# Patient Record
Sex: Female | Born: 1970 | Race: White | Hispanic: No | Marital: Married | Smoking: Never smoker
Health system: Southern US, Community
[De-identification: ages and names within clinical notes are randomized; demographics above are authoritative.]

## PROBLEM LIST (undated history)

## (undated) DIAGNOSIS — G43909 Migraine, unspecified, not intractable, without status migrainosus: Secondary | ICD-10-CM

## (undated) HISTORY — PX: ANTERIOR CRUCIATE LIGAMENT REPAIR: SHX115

## (undated) HISTORY — DX: Migraine, unspecified, not intractable, without status migrainosus: G43.909

## (undated) HISTORY — PX: ABDOMINAL HYSTERECTOMY: SHX81

---

## 1997-07-22 HISTORY — PX: AUGMENTATION MAMMAPLASTY: SUR837

## 2008-02-15 ENCOUNTER — Ambulatory Visit: Payer: Self-pay | Admitting: Physician Assistant

## 2011-08-22 ENCOUNTER — Other Ambulatory Visit: Payer: Self-pay | Admitting: Unknown Physician Specialty

## 2011-08-22 DIAGNOSIS — M25562 Pain in left knee: Secondary | ICD-10-CM

## 2011-08-30 ENCOUNTER — Ambulatory Visit
Admission: RE | Admit: 2011-08-30 | Discharge: 2011-08-30 | Disposition: A | Discharge status: Home / Self Care | Payer: BC Managed Care – PPO | Source: Ambulatory Visit | Attending: Unknown Physician Specialty | Admitting: Unknown Physician Specialty

## 2011-08-30 DIAGNOSIS — M25562 Pain in left knee: Secondary | ICD-10-CM

## 2011-10-14 ENCOUNTER — Ambulatory Visit: Payer: Self-pay | Admitting: Unknown Physician Specialty

## 2012-06-10 ENCOUNTER — Ambulatory Visit: Payer: Self-pay | Admitting: Internal Medicine

## 2013-07-06 ENCOUNTER — Ambulatory Visit: Payer: Self-pay | Admitting: Obstetrics and Gynecology

## 2013-07-06 LAB — BASIC METABOLIC PANEL
Calcium, Total: 9.6 mg/dL (ref 8.5–10.1)
Co2: 28 mmol/L (ref 21–32)
Creatinine: 0.69 mg/dL (ref 0.60–1.30)
EGFR (African American): >60
Glucose: 100 mg/dL — ABNORMAL HIGH (ref 65–99)
Potassium: 4.4 mmol/L (ref 3.5–5.1)
Sodium: 137 mmol/L (ref 136–145)

## 2013-07-06 LAB — HEMOGLOBIN: HGB: 14.3 g/dL (ref 12.0–16.0)

## 2013-07-09 ENCOUNTER — Inpatient Hospital Stay: Payer: Self-pay | Admitting: Obstetrics and Gynecology

## 2013-07-10 LAB — BASIC METABOLIC PANEL
BUN: 9 mg/dL (ref 7–18)
Co2: 27 mmol/L (ref 21–32)
Creatinine: 0.65 mg/dL (ref 0.60–1.30)
Potassium: 4.1 mmol/L (ref 3.5–5.1)

## 2013-07-10 LAB — HEMATOCRIT: HCT: 35.7 % (ref 35.0–47.0)

## 2013-08-06 ENCOUNTER — Ambulatory Visit: Payer: Self-pay | Admitting: Obstetrics and Gynecology

## 2014-09-09 ENCOUNTER — Ambulatory Visit: Payer: Self-pay | Admitting: Internal Medicine

## 2014-11-11 NOTE — Op Note (Signed)
PATIENT NAME:  Felicia Greene, Felicia Greene MR#:  [id number] DATE OF BIRTH:  Dec 13, 1970  DATE OF PROCEDURE:  07/09/2013  PREOPERATIVE DIAGNOSIS: Menorrhagia.   POSTOPERATIVE DIAGNOSIS: Menorrhagia.   PROCEDURES:  1,  Total abdominal hysterectomy.  2.  Bilateral salpingectomies.   ANESTHESIA: General endotracheal anesthesia.  SURGEON: Suzy Bouchardhomas J Schermerhorn, M.D.   FIRST ASSISTANT: Dr. Logan BoresEvans.   INDICATIONS: This is a 44 year old gravida 0 patient with a long history of menorrhagia failing hormonal therapy. The patient has elected for permanent surgical intervention.   PROCEDURE: After adequate general endotracheal anesthesia, the patient was placed in the dorsal supine position. The patient was prepped and draped in normal sterile fashion. Cefoxitin 2 grams of IV was administered prior to commencement. The patient's bladder was then drained with a Foley catheter. A Pfannenstiel incision was then made 2 fingerbreadths above the symphysis pubis. Sharp dissection was used to identify the fascia. The fascia was opened in the midline and opened in a transverse fashion. The superior aspect of the fascia was grasped with Kocher clamps and the recti muscles were dissected free. The inferior aspect of the fascia were grasped with Kocher clamps and the pyramidalis muscle was dissected free. Entry into the peritoneal cavity was accomplished sharply. The O'Connor-O'Sullivan retractor was then brought up to the operative field. The bowels were packed cephalad. Each cornu was grasped with a large Kelly clamp. Attention was directed to the patient's left fallopian tube, which was clamped at the mesosalpinx and the left fallopian tube was removed. Suture ligation of the pedicle with 2-0 Vicryl suture. A similar procedure was repeated on the patient's right fallopian tube after clamping the mesosalpinx and removal of the right fallopian tube pedicle. It again was tied with 2-0 Vicryl suture.   Round ligaments were clamped  bilaterally, transected, and suture ligated with 0 Vicryl suture. A window in the broad ligament was made and the utero-ovarian ligaments were bilaterally clamped, transected, and suture ligated with 0 Vicryl suture. Ovaries appeared normal bilaterally. Uterine arteries were then bilaterally skeletonized after the anterior leaf of the broad ligament was incised along the bladder and reflected inferiorly. The uterine arteries were then bilaterally clamped with Heaney clamps, transected, and suture ligated with 0 Vicryl suture. The Cardinal ligaments were clamped with straight Heaney clamps and pedicles were suture ligated with 0 Vicryl suture. The angles were then clamped with curved Heaney clamps and the cervix and uterus were removed with Jorgenson scissors. The vaginal cuff was then closed with 0 Vicryl suture interrupted sutures. The patient's abdomen was copiously irrigated. Good hemostasis was noted. There was normal peristaltic activity of the bilateral ureters. The sponges were then removed and the retractor was removed. The fascia was then closed with 0 Vicryl suture in a running nonlocking fashion. Good approximation of edges. Good hemostasis was noted. The subcutaneous tissues were irrigated and bovied for hemostasis, and the skin was reapproximated with staples. There were no complications. Estimated blood loss 25 mL. Intraoperative fluids 1400 mL. Urine output 150 mL. The patient tolerated the procedure well and was taken to the PACU. Of note, the patient had SCDs on during the procedure.   ____________________________ Suzy Bouchardhomas J. Schermerhorn, MD tjs:aw D: 07/09/2013 10:00:07 ET T: 07/09/2013 10:33:54 ET JOB#: [id number]  cc: Suzy Bouchardhomas J. Schermerhorn, MD, <Dictator> Suzy BouchardHOMAS J SCHERMERHORN MD ELECTRONICALLY SIGNED 07/12/2013 10:30

## 2014-11-13 NOTE — Op Note (Signed)
PATIENT NAME:  Felicia Greene, Felicia Greene MR#:  [id number] DATE OF BIRTH:  1971-02-22  DATE OF PROCEDURE:  10/14/2011  PREOPERATIVE DIAGNOSIS:  Torn anterior cruciate ligament, left knee.   POSTOPERATIVE DIAGNOSIS:  Torn anterior cruciate ligament, left knee.  OPERATION: Arthroscopic allograft anterior cruciate ligament reconstruction on the left.   SURGEON: Alda BertholdHarold B Dashonna Chagnon, Jr., M.D.   ANESTHESIA: General.   HISTORY: The patient had a remote injury to her left knee. She had clinical and MRI evidence for a torn anterior cruciate ligament. She had KT1000 that was also consistent with torn anterior cruciate ligament. The patient ultimately was brought in for arthroscopic allograft reconstruction of her left anterior cruciate ligament due to her persistent symptoms.   DESCRIPTION OF PROCEDURE: The patient was taken to the operating room where satisfactory general anesthesia was achieved. A tourniquet and legholder were applied to her left thigh. The left lower extremity was prepped and draped in the usual fashion for an arthroscopic procedure. The patient incidentally was given a gram of vancomycin IV prior to the start of the procedure. Next, the left lower extremity was exsanguinated and the tourniquet was inflated. An inflow cannula was introduced superomedially. The joint was distended with lactated Ringer's. We used the Mitek fluid pump to facilitate joint distention.   The scope was introduced through an inferolateral puncture wound and a probe through an inferomedial puncture wound.   Inspection of the medial compartment failed to reveal any meniscal or chondral pathology. Inspection of the intercondylar notch revealed the patient had a complete anterior cruciate ligament tear. Inspection of the lateral compartment also revealed no significant meniscal or chondral pathology.   Incidentally, the retropatellar surface and trochlear grooves were also smooth.   I went ahead and inserted a synovial  resector through the inferomedial portal and resected the torn anterior cruciate ligament. The residual stump was debrided with an angled ArthroCare thermal wand. I then used the angled wand on the lateral aspect of the intercondylar notch to remove soft tissue from the lateral aspect of the intercondylar notch. A large round bur was used to perform a superior and lateral notchplasty.  Next I used the Arthrex tibial aimer to drill a guidewire through the medial tibial metaphysis  into the tibial footprint of the anterior cruciate ligament. The guide was set for a 55-degree angle and our tibial tunnel was set for 4 cm.   The guidewire was placed through a small puncture wound medial to the tibial tubercle.   I then reamed over the guidewire with a 10-mm drill. Debris was removed from the intraarticular portion of the tibial tunnel.   I next inserted a 7-mm over-the-top femoral aimer through the tibial tunnel. It was placed on the superolateral portion of the intercondylar notch. Once it was appropriately positioned, a guidewire was introduced through the aimer up into the lateral femoral condyle. The aimer was removed and then I inserted an Acorn 10-mm reamer over the guidewire and reamed to a depth of about 34 to 35 mm. The aimer and guidewire were removed. Debris was suctioned from the joint.   I then inserted the previously prepared tibialis allograft. It was doubled on itself.  It was placed through the AperFix inserter. The graft was 9 mm in width.  The AperFix inserter with the graft was tapped up into the femoral socket and the wings on the femoral fixation device were deployed. The actual inserter rod was removed and then the graft was tensioned with the knee in extension.  It was fixed to the tibial tunnel with a 10 x 30-mm interference screw. The tibial repair was reinforced with a small Richards staple that was driven over the tail end of the graft. The redundant portion of the graft was cut off.   The tourniquet was released. It had been up about 76 minutes. The tibial wound was irrigated with GU irrigant. I closed the subcutaneous in the tibial wound with 2-0 Vicryl and the skin with skin staples. The puncture wounds were closed with 3-0 nylon in vertical mattress fashion. A total of 20 mL of 0.5% Marcaine without epinephrine was injected about the puncture wounds and the tibial incision.   Betadine was applied to the wounds followed by a sterile dressing. Four TENS pads were placed around the dressing and then a postoperative range of motion knee brace was applied. The brace was locked in extension. I then applied an ice pack over the brace.   The patient was then awakened and transferred to her stretcher bed. She was taken to the recovery room in satisfactory condition. Blood loss was negligible.   SUMMARY OF IMPLANTS USED: 10 x 29 AperFix femoral implant and a 10 x 30 tibial TPEEK interference screw.    Incidentally,  the tourniquet was up 76 minutes and it was released at the end of the procedure. Bleeding was negligible.     ____________________________ Alda Berthold., MD hbk:bjt D: 10/14/2011 10:44:25 ET T: 10/14/2011 11:04:37 ET JOB#: [id number]  cc: Alda Berthold., MD, <Dictator> Randon Goldsmith, JR MD ELECTRONICALLY SIGNED 11/10/2011 21:20

## 2016-05-28 ENCOUNTER — Other Ambulatory Visit: Payer: Self-pay | Admitting: Physician Assistant

## 2016-05-28 DIAGNOSIS — Z1231 Encounter for screening mammogram for malignant neoplasm of breast: Secondary | ICD-10-CM

## 2016-05-30 ENCOUNTER — Other Ambulatory Visit: Payer: Self-pay | Admitting: Unknown Physician Specialty

## 2016-05-30 ENCOUNTER — Other Ambulatory Visit (HOSPITAL_COMMUNITY): Payer: Self-pay | Admitting: Unknown Physician Specialty

## 2016-05-30 DIAGNOSIS — M25562 Pain in left knee: Secondary | ICD-10-CM

## 2016-07-26 ENCOUNTER — Ambulatory Visit
Admission: RE | Admit: 2016-07-26 | Discharge: 2016-07-26 | Disposition: A | Discharge status: Home / Self Care | Payer: BLUE CROSS/BLUE SHIELD | Source: Ambulatory Visit | Attending: Physician Assistant | Admitting: Physician Assistant

## 2016-07-26 DIAGNOSIS — Z1231 Encounter for screening mammogram for malignant neoplasm of breast: Secondary | ICD-10-CM

## 2016-09-17 ENCOUNTER — Other Ambulatory Visit: Payer: Self-pay | Admitting: Unknown Physician Specialty

## 2016-09-17 DIAGNOSIS — M25562 Pain in left knee: Principal | ICD-10-CM

## 2016-09-17 DIAGNOSIS — G8929 Other chronic pain: Secondary | ICD-10-CM

## 2016-10-04 ENCOUNTER — Ambulatory Visit: Payer: BLUE CROSS/BLUE SHIELD

## 2016-10-05 ENCOUNTER — Ambulatory Visit (HOSPITAL_COMMUNITY)
Admission: RE | Admit: 2016-10-05 | Discharge: 2016-10-05 | Disposition: A | Discharge status: Home / Self Care | Payer: BLUE CROSS/BLUE SHIELD | Source: Ambulatory Visit | Attending: Unknown Physician Specialty | Admitting: Unknown Physician Specialty

## 2016-10-05 DIAGNOSIS — G8929 Other chronic pain: Secondary | ICD-10-CM | POA: Diagnosis not present

## 2016-10-05 DIAGNOSIS — M6752 Plica syndrome, left knee: Secondary | ICD-10-CM | POA: Insufficient documentation

## 2016-10-05 DIAGNOSIS — M94262 Chondromalacia, left knee: Secondary | ICD-10-CM | POA: Diagnosis not present

## 2016-10-05 DIAGNOSIS — M25562 Pain in left knee: Secondary | ICD-10-CM | POA: Diagnosis present

## 2017-07-30 ENCOUNTER — Other Ambulatory Visit: Payer: Self-pay | Admitting: Physician Assistant

## 2017-07-30 DIAGNOSIS — Z1231 Encounter for screening mammogram for malignant neoplasm of breast: Secondary | ICD-10-CM

## 2017-08-21 ENCOUNTER — Ambulatory Visit
Admission: RE | Admit: 2017-08-21 | Discharge: 2017-08-21 | Disposition: A | Discharge status: Home / Self Care | Payer: BLUE CROSS/BLUE SHIELD | Source: Ambulatory Visit | Attending: Physician Assistant | Admitting: Physician Assistant

## 2017-08-21 DIAGNOSIS — Z1231 Encounter for screening mammogram for malignant neoplasm of breast: Secondary | ICD-10-CM | POA: Diagnosis not present

## 2018-06-15 DIAGNOSIS — Z0000 Encounter for general adult medical examination without abnormal findings: Secondary | ICD-10-CM | POA: Diagnosis not present

## 2018-06-15 DIAGNOSIS — Z23 Encounter for immunization: Secondary | ICD-10-CM | POA: Diagnosis not present

## 2018-06-15 DIAGNOSIS — Z13220 Encounter for screening for lipoid disorders: Secondary | ICD-10-CM | POA: Diagnosis not present

## 2018-07-30 ENCOUNTER — Other Ambulatory Visit: Payer: Self-pay | Admitting: Physician Assistant

## 2018-07-30 DIAGNOSIS — Z1231 Encounter for screening mammogram for malignant neoplasm of breast: Secondary | ICD-10-CM

## 2018-08-28 ENCOUNTER — Ambulatory Visit
Admission: RE | Admit: 2018-08-28 | Discharge: 2018-08-28 | Disposition: A | Discharge status: Home / Self Care | Payer: BLUE CROSS/BLUE SHIELD | Source: Ambulatory Visit | Attending: Physician Assistant | Admitting: Physician Assistant

## 2018-08-28 DIAGNOSIS — Z1231 Encounter for screening mammogram for malignant neoplasm of breast: Secondary | ICD-10-CM | POA: Diagnosis not present

## 2018-09-09 DIAGNOSIS — R1084 Generalized abdominal pain: Secondary | ICD-10-CM | POA: Diagnosis not present

## 2018-09-09 DIAGNOSIS — R0902 Hypoxemia: Secondary | ICD-10-CM | POA: Diagnosis not present

## 2018-09-09 DIAGNOSIS — R55 Syncope and collapse: Secondary | ICD-10-CM | POA: Diagnosis not present

## 2018-09-09 DIAGNOSIS — R52 Pain, unspecified: Secondary | ICD-10-CM | POA: Diagnosis not present

## 2018-09-18 DIAGNOSIS — R1031 Right lower quadrant pain: Secondary | ICD-10-CM | POA: Diagnosis not present

## 2018-09-18 DIAGNOSIS — R55 Syncope and collapse: Secondary | ICD-10-CM | POA: Diagnosis not present

## 2018-09-18 DIAGNOSIS — R1032 Left lower quadrant pain: Secondary | ICD-10-CM | POA: Diagnosis not present

## 2018-09-18 DIAGNOSIS — Z1389 Encounter for screening for other disorder: Secondary | ICD-10-CM | POA: Diagnosis not present

## 2018-09-18 DIAGNOSIS — Z0000 Encounter for general adult medical examination without abnormal findings: Secondary | ICD-10-CM | POA: Diagnosis not present

## 2018-10-22 ENCOUNTER — Other Ambulatory Visit: Payer: Self-pay

## 2018-10-23 ENCOUNTER — Encounter: Payer: Self-pay | Admitting: Obstetrics & Gynecology

## 2018-10-23 ENCOUNTER — Ambulatory Visit: Payer: BLUE CROSS/BLUE SHIELD | Admitting: Obstetrics & Gynecology

## 2018-10-23 ENCOUNTER — Ambulatory Visit: Payer: Self-pay | Admitting: Obstetrics & Gynecology

## 2018-10-23 VITALS — BP 126/78 | Ht 66.0 in | Wt 136.0 lb

## 2018-10-23 DIAGNOSIS — Z01419 Encounter for gynecological examination (general) (routine) without abnormal findings: Secondary | ICD-10-CM

## 2018-10-23 DIAGNOSIS — Z1272 Encounter for screening for malignant neoplasm of vagina: Secondary | ICD-10-CM

## 2018-10-23 DIAGNOSIS — Z90710 Acquired absence of both cervix and uterus: Secondary | ICD-10-CM

## 2018-10-23 NOTE — Progress Notes (Signed)
    Felicia Greene 1970-11-06 [id number]   History:    48 y.o. G0 Married  RP:  New patient presenting for annual gyn exam   HPI: S/P TAH.  Occasional abdominal cramping, probably GI origin.  No menopausal symptoms.  Urine and bowel movements within normal.  Breast normal.  Body mass index 21.95.  Physically active.  Health labs with family physician.  Past medical history,surgical history, family history and social history were all reviewed and documented in the EPIC chart.  Gynecologic History No LMP recorded. Patient has had a hysterectomy. Contraception: status post hysterectomy Last Pap: 6 yrs ago normal Last mammogram: 08/2018. Results were: Negative Bone Density: Never Colonoscopy: Never  Obstetric History OB History  Gravida Para Term Preterm AB Living  0 0 0 0 0 0  SAB TAB Ectopic Multiple Live Births  0 0 0 0 0     ROS: A ROS was performed and pertinent positives and negatives are included in the history.  GENERAL: No fevers or chills. HEENT: No change in vision, no earache, sore throat or sinus congestion. NECK: No pain or stiffness. CARDIOVASCULAR: No chest pain or pressure. No palpitations. PULMONARY: No shortness of breath, cough or wheeze. GASTROINTESTINAL: No abdominal pain, nausea, vomiting or diarrhea, melena or bright red blood per rectum. GENITOURINARY: No urinary frequency, urgency, hesitancy or dysuria. MUSCULOSKELETAL: No joint or muscle pain, no back pain, no recent trauma. DERMATOLOGIC: No rash, no itching, no lesions. ENDOCRINE: No polyuria, polydipsia, no heat or cold intolerance. No recent change in weight. HEMATOLOGICAL: No anemia or easy bruising or bleeding. NEUROLOGIC: No headache, seizures, numbness, tingling or weakness. PSYCHIATRIC: No depression, no loss of interest in normal activity or change in sleep pattern.     Exam:   BP 126/78   Ht 5\' 6"  (1.676 m)   Wt 136 lb (61.7 kg)   BMI 21.95 kg/m   Body mass index is 21.95 kg/m.  General  appearance : Well developed well nourished female. No acute distress HEENT: Eyes: no retinal hemorrhage or exudates,  Neck supple, trachea midline, no carotid bruits, no thyroidmegaly Lungs: Clear to auscultation, no rhonchi or wheezes, or rib retractions  Heart: Regular rate and rhythm, no murmurs or gallops Breast:Examined in sitting and supine position were symmetrical in appearance, no palpable masses or tenderness,  no skin retraction, no nipple inversion, no nipple discharge, no skin discoloration, no axillary or supraclavicular lymphadenopathy Abdomen: no palpable masses or tenderness, no rebound or guarding Extremities: no edema or skin discoloration or tenderness  Pelvic: Vulva: Normal             Vagina: No gross lesions or discharge.  Pap reflex done  Cervix/Uterus absent  Adnexa  Without masses or tenderness  Anus: Normal   Assessment/Plan:  48 y.o. female for annual exam   1. Encounter for Papanicolaou smear of vagina as part of routine gynecological examination Gynecologic exam status post TAH.  Pap reflex done on the vaginal vault.  Breast exam normal.  Screening mammogram February 2020 was negative.  Health labs with family physician.  Good body mass index at 21.95.  Continue with fitness and healthy nutrition.  2. S/P TAH (total abdominal hysterectomy)   Genia Del MD, 11:59 AM 10/23/2018

## 2018-10-25 ENCOUNTER — Encounter: Payer: Self-pay | Admitting: Obstetrics & Gynecology

## 2018-10-25 NOTE — Patient Instructions (Signed)
1. Encounter for Papanicolaou smear of vagina as part of routine gynecological examination Gynecologic exam status post TAH.  Pap reflex done on the vaginal vault.  Breast exam normal.  Screening mammogram February 2020 was negative.  Health labs with family physician.  Good body mass index at 21.95.  Continue with fitness and healthy nutrition.  2. S/P TAH (total abdominal hysterectomy)  Felicia Greene, it was a pleasure seeing you today!  I will inform you of your results as soon as they are available.

## 2018-10-26 LAB — PAP IG W/ RFLX HPV ASCU

## 2018-10-27 ENCOUNTER — Encounter: Payer: Self-pay | Admitting: *Deleted

## 2019-01-15 DIAGNOSIS — Z111 Encounter for screening for respiratory tuberculosis: Secondary | ICD-10-CM | POA: Diagnosis not present

## 2019-01-15 DIAGNOSIS — Z23 Encounter for immunization: Secondary | ICD-10-CM | POA: Diagnosis not present

## 2019-08-16 ENCOUNTER — Other Ambulatory Visit: Payer: Self-pay | Admitting: Physician Assistant

## 2019-08-16 DIAGNOSIS — Z1231 Encounter for screening mammogram for malignant neoplasm of breast: Secondary | ICD-10-CM

## 2019-11-29 ENCOUNTER — Ambulatory Visit: Payer: BLUE CROSS/BLUE SHIELD

## 2019-12-06 ENCOUNTER — Ambulatory Visit
Admission: RE | Admit: 2019-12-06 | Discharge: 2019-12-06 | Disposition: A | Discharge status: Home / Self Care | Payer: BC Managed Care – PPO | Source: Ambulatory Visit | Attending: Physician Assistant | Admitting: Physician Assistant

## 2019-12-06 ENCOUNTER — Other Ambulatory Visit: Payer: Self-pay

## 2019-12-06 DIAGNOSIS — Z1231 Encounter for screening mammogram for malignant neoplasm of breast: Secondary | ICD-10-CM

## 2020-02-09 ENCOUNTER — Ambulatory Visit: Payer: BC Managed Care – PPO | Admitting: Obstetrics & Gynecology

## 2020-02-09 ENCOUNTER — Other Ambulatory Visit: Payer: Self-pay

## 2020-02-09 ENCOUNTER — Encounter: Payer: Self-pay | Admitting: Obstetrics & Gynecology

## 2020-02-09 VITALS — BP 122/70 | Ht 66.0 in | Wt 134.0 lb

## 2020-02-09 DIAGNOSIS — Z01419 Encounter for gynecological examination (general) (routine) without abnormal findings: Secondary | ICD-10-CM

## 2020-02-09 DIAGNOSIS — Z90710 Acquired absence of both cervix and uterus: Secondary | ICD-10-CM

## 2020-02-09 NOTE — Progress Notes (Signed)
    Felicia Greene [birth year] [id number]   History:    49 y.o. G0 Married  RP:  Established patient presenting for annual gyn exam   HPI: S/P TAH.  No menopausal symptoms.  No pelvic pain.  No pain with IC.  Urine and bowel movements within normal.  Breasts s/p bilateral augmentation, normal.  Body mass index 21.63.  Physically active.  Health labs with family physician at [facility].  Will schedule a screening Colonoscopy.   Past medical history,surgical history, family history and social history were all reviewed and documented in the EPIC chart.  Gynecologic History No LMP recorded. Patient has had a hysterectomy.  Obstetric History OB History  Gravida Para Term Preterm AB Living  0 0 0 0 0 0  SAB TAB Ectopic Multiple Live Births  0 0 0 0 0     ROS: A ROS was performed and pertinent positives and negatives are included in the history.  GENERAL: No fevers or chills. HEENT: No change in vision, no earache, sore throat or sinus congestion. NECK: No pain or stiffness. CARDIOVASCULAR: No chest pain or pressure. No palpitations. PULMONARY: No shortness of breath, cough or wheeze. GASTROINTESTINAL: No abdominal pain, nausea, vomiting or diarrhea, melena or bright red blood per rectum. GENITOURINARY: No urinary frequency, urgency, hesitancy or dysuria. MUSCULOSKELETAL: No joint or muscle pain, no back pain, no recent trauma. DERMATOLOGIC: No rash, no itching, no lesions. ENDOCRINE: No polyuria, polydipsia, no heat or cold intolerance. No recent change in weight. HEMATOLOGICAL: No anemia or easy bruising or bleeding. NEUROLOGIC: No headache, seizures, numbness, tingling or weakness. PSYCHIATRIC: No depression, no loss of interest in normal activity or change in sleep pattern.     Exam:   BP 122/70   Ht 5\' 6"  (1.676 m)   Wt 134 lb (60.8 kg)   BMI 21.63 kg/m   Body mass index is 21.63 kg/m.  General appearance : Well developed well nourished female. No acute distress HEENT: Eyes: no  retinal hemorrhage or exudates,  Neck supple, trachea midline, no carotid bruits, no thyroidmegaly Lungs: Clear to auscultation, no rhonchi or wheezes, or rib retractions  Heart: Regular rate and rhythm, no murmurs or gallops Breast:Examined in sitting and supine position were symmetrical in appearance, no palpable masses or tenderness,  no skin retraction, no nipple inversion, no nipple discharge, no skin discoloration, no axillary or supraclavicular lymphadenopathy Abdomen: no palpable masses or tenderness, no rebound or guarding Extremities: no edema or skin discoloration or tenderness  Pelvic: Vulva: Normal             Vagina: No gross lesions or discharge  Cervix/Uterus Absent  Adnexa  Without masses or tenderness  Anus: Normal   Assessment/Plan:  49 y.o. female for annual exam   1. Well female exam with routine gynecological exam Gynecologic exam status post TAH.  Pap test April 2020 was negative, no indication to repeat this year.  Breast status post bilateral augmentation, normal exam.  Screening mammogram in May 2021 was negative.  Will schedule a colonoscopy through her family physician.  Health labs with family physician at [facility].  2. S/P TAH (total abdominal hysterectomy)  Other orders - methocarbamol (ROBAXIN) 500 MG tablet; Take 500 mg by mouth 4 (four) times daily. - gabapentin (NEURONTIN) 100 MG capsule; Take 100 mg by mouth 3 (three) times daily.  [facility] MD, 3:43 PM 02/09/2020

## 2021-02-05 ENCOUNTER — Other Ambulatory Visit: Payer: Self-pay | Admitting: Family Medicine

## 2021-02-05 DIAGNOSIS — Z1231 Encounter for screening mammogram for malignant neoplasm of breast: Secondary | ICD-10-CM

## 2021-02-06 ENCOUNTER — Other Ambulatory Visit: Payer: Self-pay

## 2021-02-06 ENCOUNTER — Ambulatory Visit
Admission: RE | Admit: 2021-02-06 | Discharge: 2021-02-06 | Disposition: A | Discharge status: Home / Self Care | Payer: Commercial Managed Care - PPO | Source: Ambulatory Visit | Attending: Family Medicine | Admitting: Family Medicine

## 2021-02-06 DIAGNOSIS — Z1231 Encounter for screening mammogram for malignant neoplasm of breast: Secondary | ICD-10-CM

## 2021-02-08 ENCOUNTER — Other Ambulatory Visit: Payer: Self-pay | Admitting: Family Medicine

## 2021-02-08 DIAGNOSIS — R928 Other abnormal and inconclusive findings on diagnostic imaging of breast: Secondary | ICD-10-CM

## 2021-02-27 ENCOUNTER — Ambulatory Visit
Admission: RE | Admit: 2021-02-27 | Discharge: 2021-02-27 | Disposition: A | Discharge status: Home / Self Care | Payer: Commercial Managed Care - PPO | Source: Ambulatory Visit | Attending: Family Medicine | Admitting: Family Medicine

## 2021-02-27 ENCOUNTER — Other Ambulatory Visit: Payer: Self-pay

## 2021-02-27 ENCOUNTER — Other Ambulatory Visit: Payer: Self-pay | Admitting: Family Medicine

## 2021-02-27 DIAGNOSIS — R928 Other abnormal and inconclusive findings on diagnostic imaging of breast: Secondary | ICD-10-CM

## 2021-04-02 ENCOUNTER — Encounter: Payer: Self-pay | Admitting: Obstetrics & Gynecology

## 2021-04-02 ENCOUNTER — Other Ambulatory Visit: Payer: Self-pay

## 2021-04-02 ENCOUNTER — Ambulatory Visit (INDEPENDENT_AMBULATORY_CARE_PROVIDER_SITE_OTHER): Payer: Commercial Managed Care - PPO | Admitting: Obstetrics & Gynecology

## 2021-04-02 VITALS — BP 112/70 | HR 80 | Resp 16 | Ht 65.75 in | Wt 135.0 lb

## 2021-04-02 DIAGNOSIS — Z90710 Acquired absence of both cervix and uterus: Secondary | ICD-10-CM

## 2021-04-02 DIAGNOSIS — Z01419 Encounter for gynecological examination (general) (routine) without abnormal findings: Secondary | ICD-10-CM | POA: Diagnosis not present

## 2021-04-02 NOTE — Progress Notes (Signed)
Felicia Greene 15-Sep-1970 [id number]   History:    50 y.o. G0 Married   RP:  Established patient presenting for annual gyn exam    HPI: S/P TAH.  No menopausal symptoms.  No pelvic pain.  No pain with IC.  Pap Neg 10/2018. Urine and bowel movements within normal.  Breasts s/p bilateral augmentation, normal.  Screening mammo Neg Rt breast/Dx mammo/US Probably Benign Lt breast 02/2021.  Body mass index 21.96.  Physically active.  Health labs with family physician at [facility]. Colonoscopy Neg in 2022.    Past medical history,surgical history, family history and social history were all reviewed and documented in the EPIC chart.  Gynecologic History No LMP recorded. Patient has had a hysterectomy.  Obstetric History OB History  Gravida Para Term Preterm AB Living  0 0 0 0 0 0  SAB IAB Ectopic Multiple Live Births  0 0 0 0 0     ROS: A ROS was performed and pertinent positives and negatives are included in the history.  GENERAL: No fevers or chills. HEENT: No change in vision, no earache, sore throat or sinus congestion. NECK: No pain or stiffness. CARDIOVASCULAR: No chest pain or pressure. No palpitations. PULMONARY: No shortness of breath, cough or wheeze. GASTROINTESTINAL: No abdominal pain, nausea, vomiting or diarrhea, melena or bright red blood per rectum. GENITOURINARY: No urinary frequency, urgency, hesitancy or dysuria. MUSCULOSKELETAL: No joint or muscle pain, no back pain, no recent trauma. DERMATOLOGIC: No rash, no itching, no lesions. ENDOCRINE: No polyuria, polydipsia, no heat or cold intolerance. No recent change in weight. HEMATOLOGICAL: No anemia or easy bruising or bleeding. NEUROLOGIC: No headache, seizures, numbness, tingling or weakness. PSYCHIATRIC: No depression, no loss of interest in normal activity or change in sleep pattern.     Exam:   BP 112/70   Pulse 80   Resp 16   Ht 5' 5.75" (1.67 m)   Wt 135 lb (61.2 kg)   BMI 21.96 kg/m   Body mass index is 21.96  kg/m.  General appearance : Well developed well nourished female. No acute distress HEENT: Eyes: no retinal hemorrhage or exudates,  Neck supple, trachea midline, no carotid bruits, no thyroidmegaly Lungs: Clear to auscultation, no rhonchi or wheezes, or rib retractions  Heart: Regular rate and rhythm, no murmurs or gallops Breast:Examined in sitting and supine position were symmetrical in appearance, no palpable masses or tenderness,  no skin retraction, no nipple inversion, no nipple discharge, no skin discoloration, no axillary or supraclavicular lymphadenopathy Abdomen: no palpable masses or tenderness, no rebound or guarding Extremities: no edema or skin discoloration or tenderness  Pelvic: Vulva: Normal             Vagina: No gross lesions or discharge  Cervix/Uterus absent  Adnexa  Without masses or tenderness  Anus: Normal   Assessment/Plan:  50 y.o. female for annual exam   1. Well female exam with routine gynecological exam Gynecologic exam status post TAH.  Pap test April 2020 was negative, no indication to repeat at this time.  Breast exam status post bilateral augmentation normal.  Screening right mammogram was negative/left diagnostic mammogram and ultrasound were probably benign August 2022.  We will repeat on the left in 6 months.  Colonoscopy 2022 was negative.  Health labs with family physician.  Good body mass index at 21.96.  Continue with fitness and healthy nutrition.  2. S/P TAH (total abdominal hysterectomy)  Other orders - albuterol (VENTOLIN HFA) 108 (90 Base) MCG/ACT  inhaler; SMARTSIG:2 Puff(s) By Mouth Every 4 Hours PRN - gabapentin (NEURONTIN) 300 MG capsule; Take 300 mg by mouth 3 (three) times daily.   Genia Del MD, 1:43 PM 04/02/2021

## 2021-08-30 ENCOUNTER — Ambulatory Visit
Admission: RE | Admit: 2021-08-30 | Discharge: 2021-08-30 | Disposition: A | Discharge status: Home / Self Care | Payer: Commercial Managed Care - PPO | Source: Ambulatory Visit | Attending: Family Medicine | Admitting: Family Medicine

## 2021-08-30 DIAGNOSIS — R928 Other abnormal and inconclusive findings on diagnostic imaging of breast: Secondary | ICD-10-CM

## 2022-03-20 ENCOUNTER — Other Ambulatory Visit: Payer: Self-pay | Admitting: Family Medicine

## 2022-03-20 DIAGNOSIS — Z1231 Encounter for screening mammogram for malignant neoplasm of breast: Secondary | ICD-10-CM

## 2022-04-10 ENCOUNTER — Ambulatory Visit
Admission: RE | Admit: 2022-04-10 | Discharge: 2022-04-10 | Disposition: A | Discharge status: Home / Self Care | Payer: Commercial Managed Care - PPO | Source: Ambulatory Visit | Attending: Family Medicine | Admitting: Family Medicine

## 2022-04-10 DIAGNOSIS — Z1231 Encounter for screening mammogram for malignant neoplasm of breast: Secondary | ICD-10-CM

## 2022-07-01 ENCOUNTER — Ambulatory Visit (INDEPENDENT_AMBULATORY_CARE_PROVIDER_SITE_OTHER): Payer: Commercial Managed Care - PPO | Admitting: Obstetrics & Gynecology

## 2022-07-01 ENCOUNTER — Encounter: Payer: Self-pay | Admitting: Obstetrics & Gynecology

## 2022-07-01 ENCOUNTER — Other Ambulatory Visit (HOSPITAL_COMMUNITY)
Admission: RE | Admit: 2022-07-01 | Discharge: 2022-07-01 | Disposition: A | Discharge status: Home / Self Care | Payer: Commercial Managed Care - PPO | Source: Ambulatory Visit | Attending: Obstetrics & Gynecology | Admitting: Obstetrics & Gynecology

## 2022-07-01 VITALS — BP 130/78 | HR 90 | Ht 66.0 in | Wt 135.0 lb

## 2022-07-01 DIAGNOSIS — Z90710 Acquired absence of both cervix and uterus: Secondary | ICD-10-CM | POA: Diagnosis not present

## 2022-07-01 DIAGNOSIS — N951 Menopausal and female climacteric states: Secondary | ICD-10-CM | POA: Diagnosis not present

## 2022-07-01 DIAGNOSIS — Z1272 Encounter for screening for malignant neoplasm of vagina: Secondary | ICD-10-CM | POA: Insufficient documentation

## 2022-07-01 DIAGNOSIS — Z01419 Encounter for gynecological examination (general) (routine) without abnormal findings: Secondary | ICD-10-CM | POA: Diagnosis present

## 2022-07-01 NOTE — Progress Notes (Signed)
Felicia Greene 1971-02-22 [id number]   History:    51 y.o. G0 Married   RP:  Established patient presenting for annual gyn exam    HPI: S/P TAH.  No menopausal symptoms.  No pelvic pain.  No pain with IC. Pap Neg 10/2018.  Pap reflex today.  Urine and bowel movements within normal.  Breasts s/p bilateral augmentation, normal.  Screening mammo Neg 03/2022.  Body mass index 21.79. Physically active.  Health labs with family physician at [facility]. Colonoscopy Neg in 2022, repeat at 10 years.   Past medical history,surgical history, family history and social history were all reviewed and documented in the EPIC chart.  Gynecologic History No LMP recorded. Patient has had a hysterectomy.  Obstetric History OB History  Gravida Para Term Preterm AB Living  0 0 0 0 0 0  SAB IAB Ectopic Multiple Live Births  0 0 0 0 0     ROS: A ROS was performed and pertinent positives and negatives are included in the history. GENERAL: No fevers or chills. HEENT: No change in vision, no earache, sore throat or sinus congestion. NECK: No pain or stiffness. CARDIOVASCULAR: No chest pain or pressure. No palpitations. PULMONARY: No shortness of breath, cough or wheeze. GASTROINTESTINAL: No abdominal pain, nausea, vomiting or diarrhea, melena or bright red blood per rectum. GENITOURINARY: No urinary frequency, urgency, hesitancy or dysuria. MUSCULOSKELETAL: No joint or muscle pain, no back pain, no recent trauma. DERMATOLOGIC: No rash, no itching, no lesions. ENDOCRINE: No polyuria, polydipsia, no heat or cold intolerance. No recent change in weight. HEMATOLOGICAL: No anemia or easy bruising or bleeding. NEUROLOGIC: No headache, seizures, numbness, tingling or weakness. PSYCHIATRIC: No depression, no loss of interest in normal activity or change in sleep pattern.     Exam:   BP 130/78   Pulse 90   Ht 5\' 6"  (1.676 m)   Wt 135 lb (61.2 kg)   SpO2 97%   BMI 21.79 kg/m   Body mass index is 21.79 kg/m.  General  appearance : Well developed well nourished female. No acute distress HEENT: Eyes: no retinal hemorrhage or exudates,  Neck supple, trachea midline, no carotid bruits, no thyroidmegaly Lungs: Clear to auscultation, no rhonchi or wheezes, or rib retractions  Heart: Regular rate and rhythm, no murmurs or gallops Breast:Examined in sitting and supine position were symmetrical in appearance, no palpable masses or tenderness,  no skin retraction, no nipple inversion, no nipple discharge, no skin discoloration, no axillary or supraclavicular lymphadenopathy Abdomen: no palpable masses or tenderness, no rebound or guarding Extremities: no edema or skin discoloration or tenderness  Pelvic: Vulva: Normal             Vagina: No gross lesions or discharge.  Pap reflex done.  Cervix/Uterus absent  Adnexa  Without masses or tenderness  Anus: Normal   Assessment/Plan:  51 y.o. female for annual exam   1. Encounter for Papanicolaou smear of vagina as part of routine gynecological examination S/P TAH.  No menopausal symptoms.  No pelvic pain.  No pain with IC. Pap Neg 10/2018.  Pap reflex today.  Urine and bowel movements within normal.  Breasts s/p bilateral augmentation, normal.  Screening mammo Neg 03/2022.  Body mass index 21.79. Physically active.  Health labs with family physician at [facility]. Colonoscopy Neg in 2022, repeat at 10 years. - Cytology - PAP( [place])  2. S/P TAH (total abdominal hysterectomy)  3. Hot flushes, perimenopausal Probably perimenopausal or menopausal.  Mild hot flushes,  declines HRT at this time.  Other orders - ibuprofen (ADVIL) 800 MG tablet; Take by mouth. - neomycin-polymyxin-hydrocortisone (CORTISPORIN) OTIC solution; SMARTSIG:In Ear(s) - omega-3 fish oil (MAXEPA) 1000 MG CAPS capsule; Take by mouth. - SUMAtriptan (IMITREX) 50 MG tablet; Take by mouth.   Genia Del MD, 4:45 PM

## 2022-07-04 ENCOUNTER — Ambulatory Visit: Payer: Commercial Managed Care - PPO | Admitting: Obstetrics & Gynecology

## 2022-07-04 LAB — CYTOLOGY - PAP: Diagnosis: NEGATIVE

## 2022-08-13 ENCOUNTER — Encounter: Payer: Self-pay | Admitting: Obstetrics & Gynecology

## 2022-12-10 IMAGING — US US BREAST*L* LIMITED INC AXILLA
1 series · 5 of 5 positions shown · non-contrast
Comparison: Previous exam(s).

CLINICAL DATA: Short-term interval follow-up of a likely benign
mass with associated calcifications involving the UPPER OUTER
QUADRANT of the LEFT breast at the 2:30 o'clock position 2 cm from
the nipple. The patient has retropectoral saline implants.

EXAM:
DIGITAL DIAGNOSTIC UNILATERAL LEFT MAMMOGRAM WITH IMPLANTS, CAD AND
TOMOSYNTHESIS; ULTRASOUND LEFT BREAST LIMITED
TECHNIQUE: Left digital diagnostic mammography and breast tomosynthesis was
performed. The images were evaluated with computer-aided detection.
Standard and/or implant displaced views were performed.; Targeted
ultrasound examination of the left breast was performed.

[Series 1: us breast*left* limited inc axilla · 0.04mm/px · 5 of 5 slices shown]
[im 1/5]
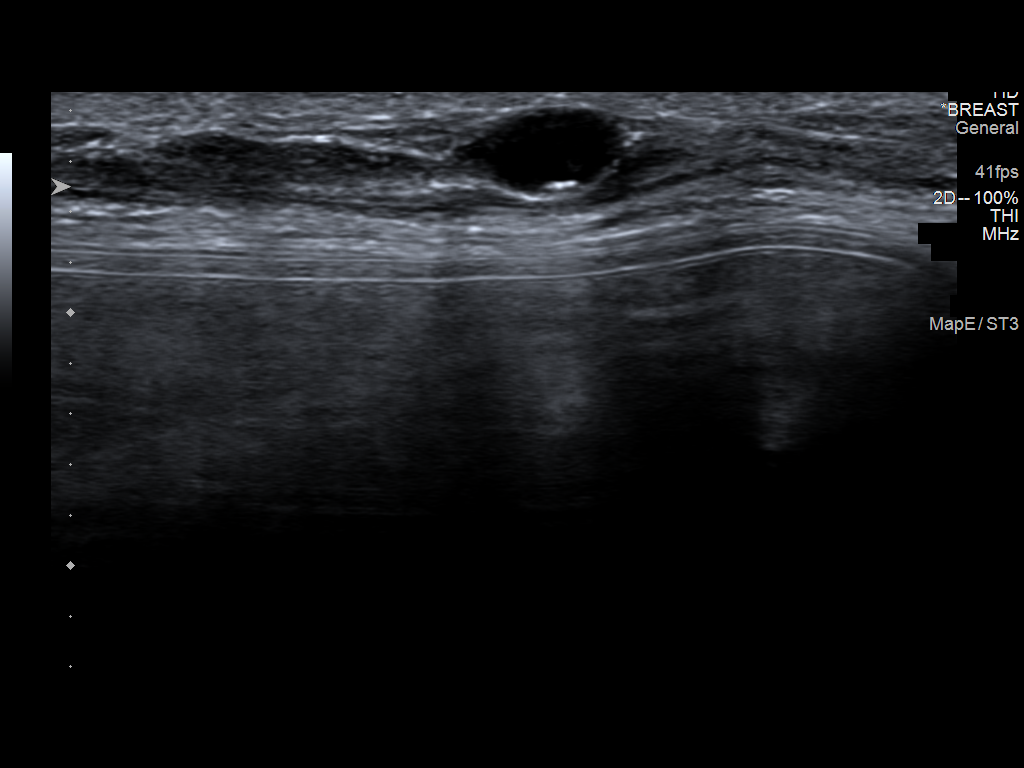
[im 2/5]
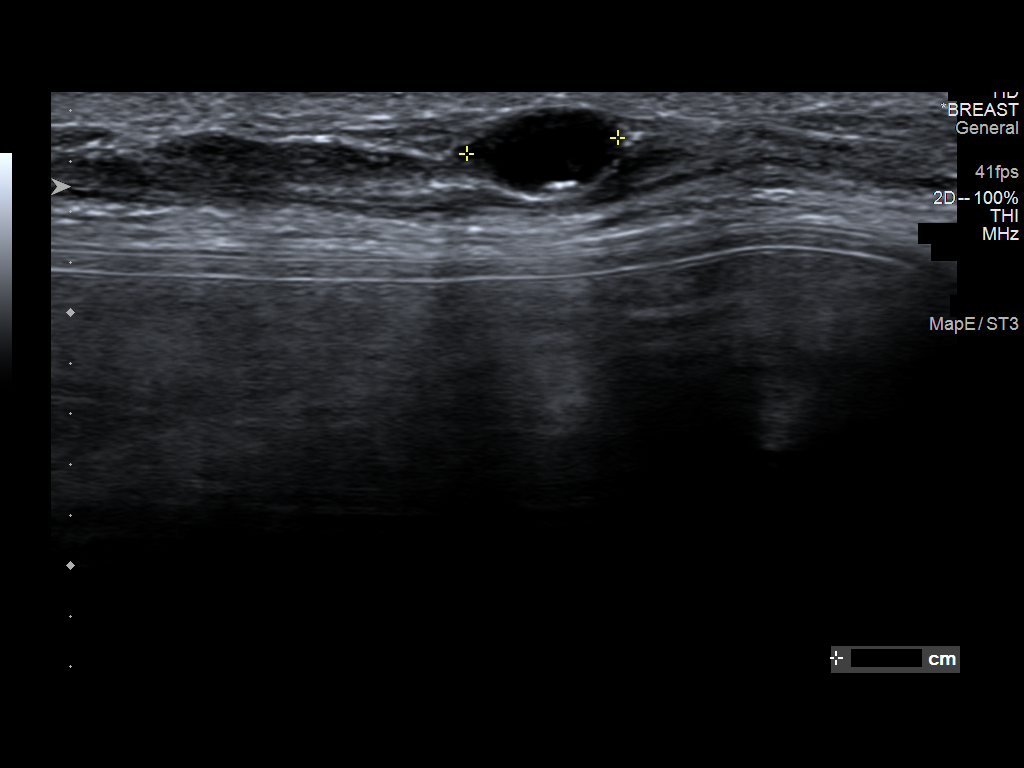
[im 3/5]
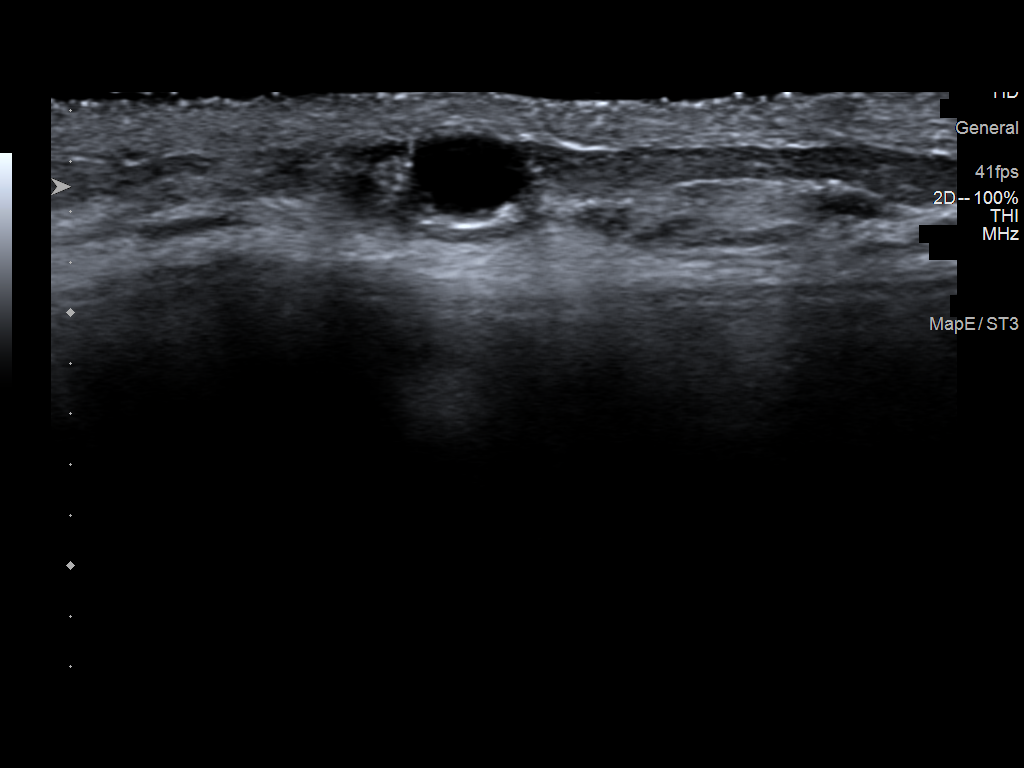
[im 4/5]
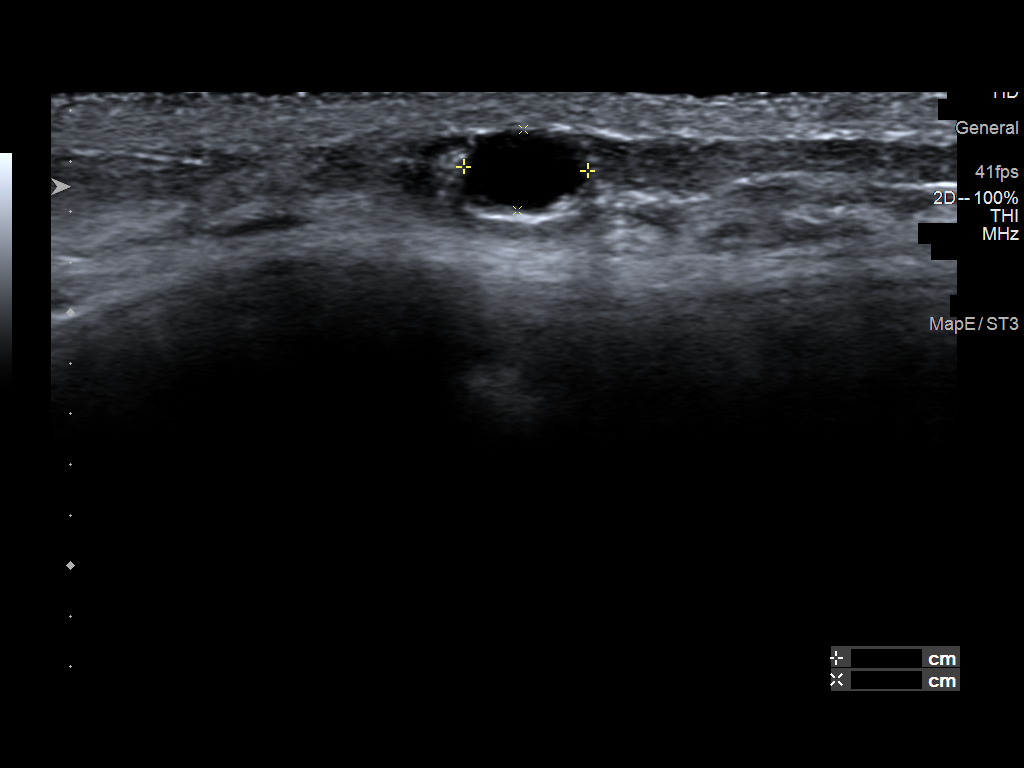
[im 5/5]
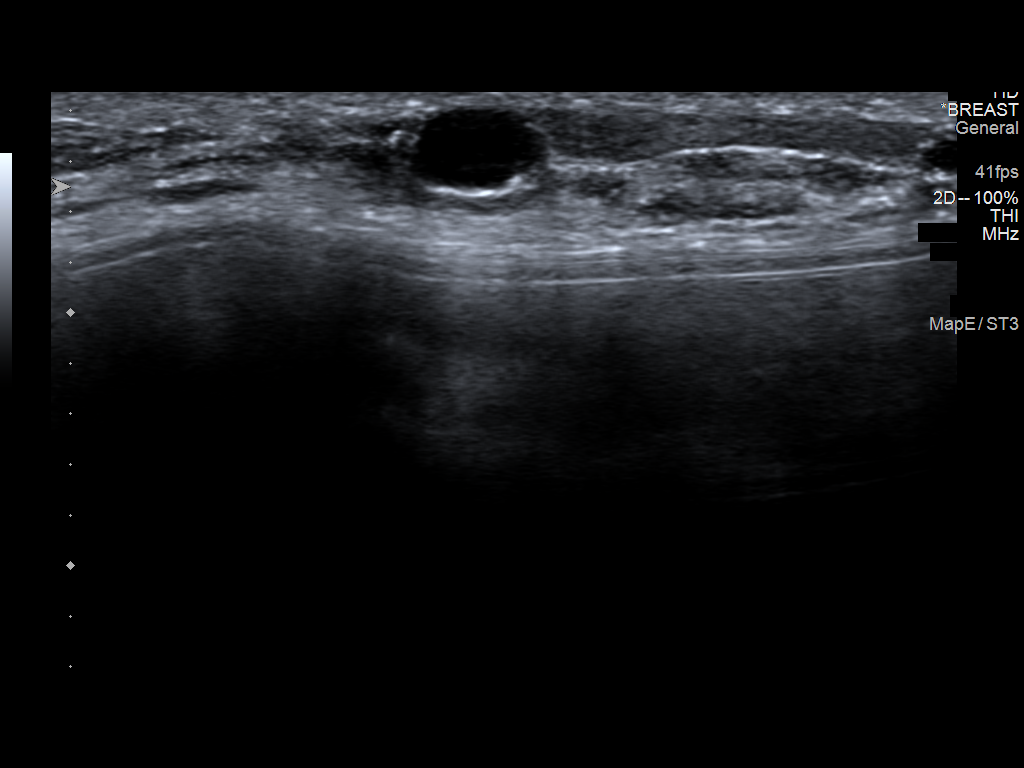

[5 of 5 positions shown; findings below may reference images not displayed]

ACR Breast Density Category c: The breast tissue is heterogeneously
dense, which may obscure small masses.
FINDINGS: The patient has retropectoral implants. Full field CC and MLO views,
implant displaced CC and MLO views, and spot magnification CC and
mediolateral views of the calcifications were obtained.

The circumscribed isodense mass with calcifications in the UPPER
OUTER QUADRANT at anterior depth is unchanged since the diagnostic
mammogram 6 months ago measuring approximately 6 mm. No new or
suspicious findings elsewhere.

Targeted ultrasound is performed, again demonstrating the anechoic
mass with peripheral wall calcifications at the 2:30 o'clock
position 2 cm from the nipple, currently measuring 6 x 3 x 5 mm
(previously 8 x 3 x 4 mm on 02/27/2021), demonstrating posterior
acoustic enhancement.
IMPRESSION: 1. No mammographic or sonographic evidence of malignancy involving
the LEFT breast.
2. Benign 6 mm mildly complicated cyst with wall calcifications in
the UPPER OUTER QUADRANT at anterior depth which has slightly
decreased in size since the prior ultrasound 6 months ago.

RECOMMENDATION:
Annual BILATERAL screening mammography which is due in January 2022.

I have discussed the findings and recommendations with the patient.
If applicable, a reminder letter will be sent to the patient
regarding the next appointment.

BI-RADS CATEGORY  2: Benign.

## 2022-12-10 IMAGING — MG MM DIGITAL DIAGNOSTIC UNILAT*L* IMPLANT W/ TOMO W/ CAD
8 series · 8 of 16 positions shown · non-contrast
Comparison: Previous exam(s).

CLINICAL DATA: Short-term interval follow-up of a likely benign
mass with associated calcifications involving the UPPER OUTER
QUADRANT of the LEFT breast at the 2:30 o'clock position 2 cm from
the nipple. The patient has retropectoral saline implants.

EXAM:
DIGITAL DIAGNOSTIC UNILATERAL LEFT MAMMOGRAM WITH IMPLANTS, CAD AND
TOMOSYNTHESIS; ULTRASOUND LEFT BREAST LIMITED
TECHNIQUE: Left digital diagnostic mammography and breast tomosynthesis was
performed. The images were evaluated with computer-aided detection.
Standard and/or implant displaced views were performed.; Targeted
ultrasound examination of the left breast was performed.

[L ML]
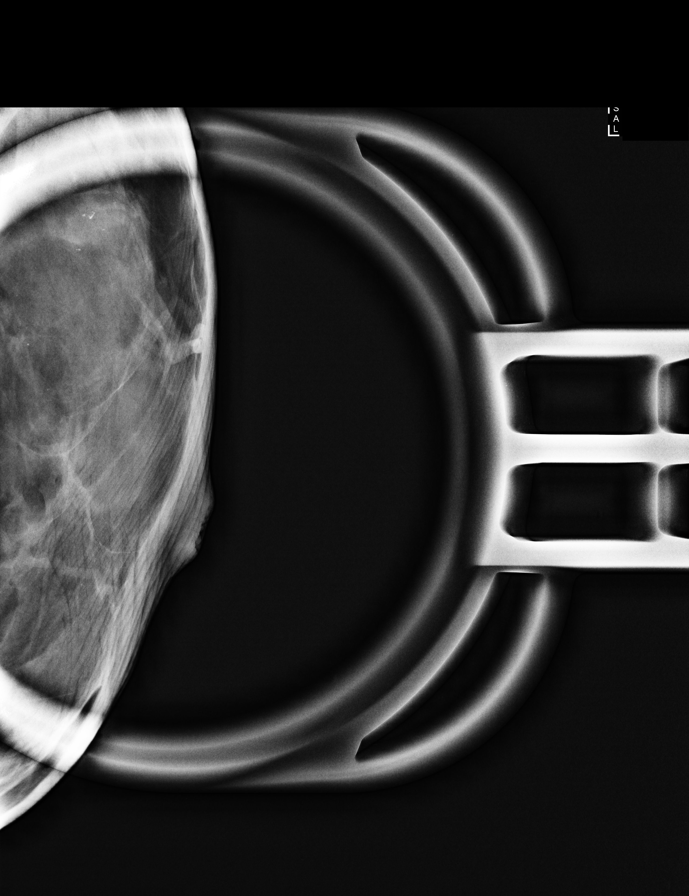

[L CC (1 of 2)]
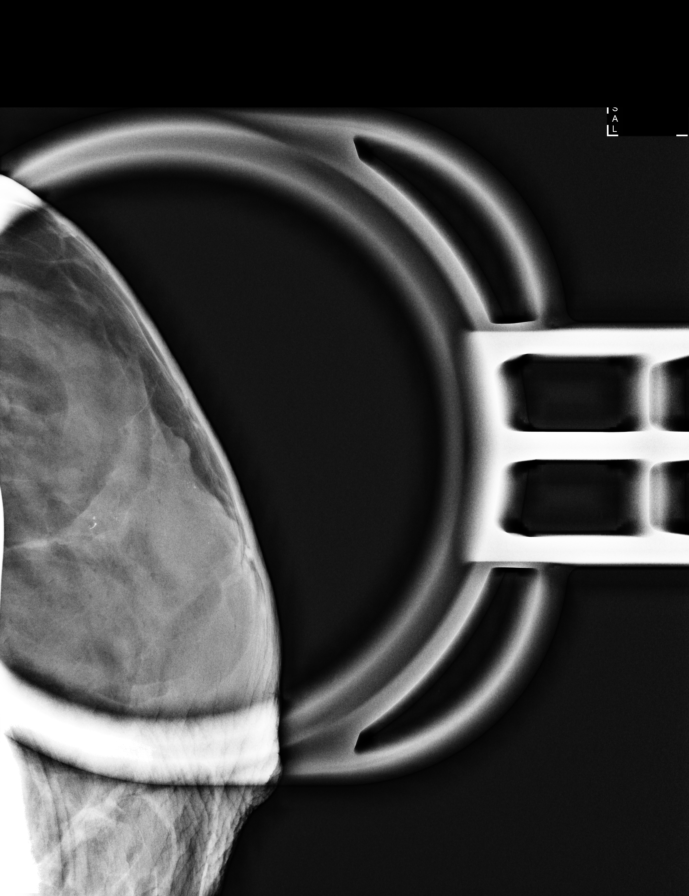

[L CC (2 of 2)]
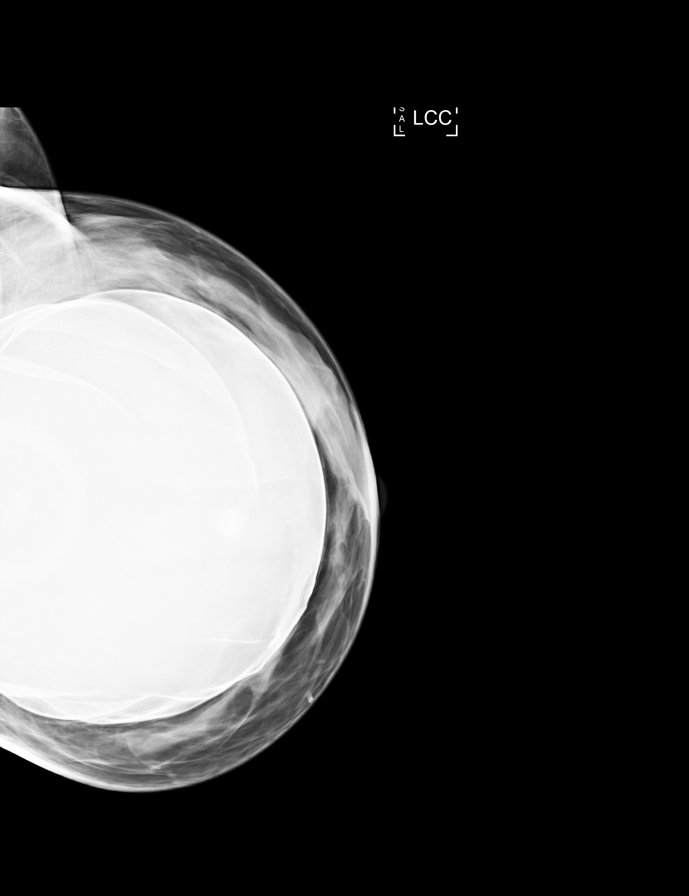

[L MLO]
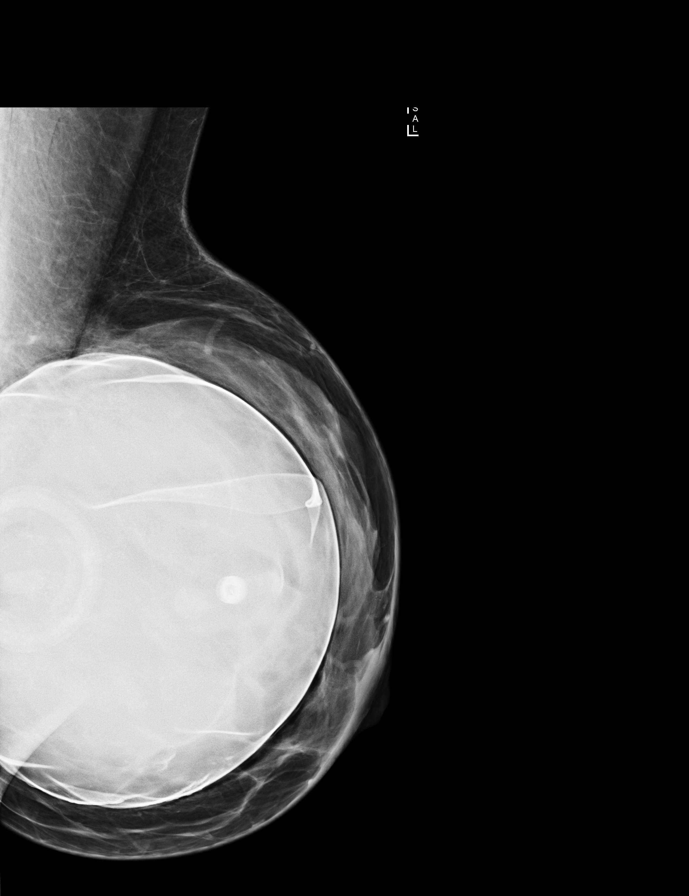

[L CC synth-2D]
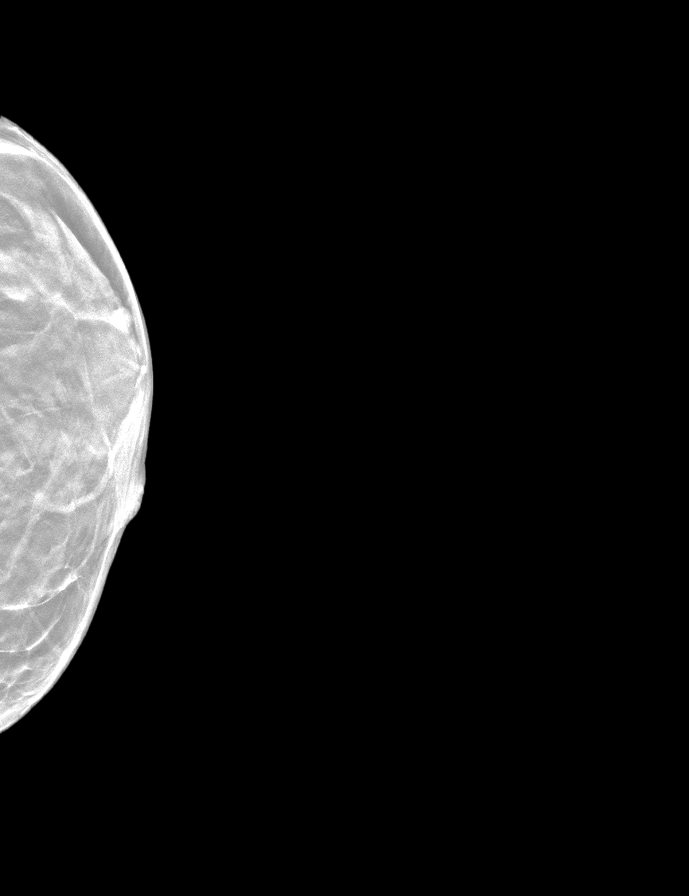

[L MLO synth-2D]
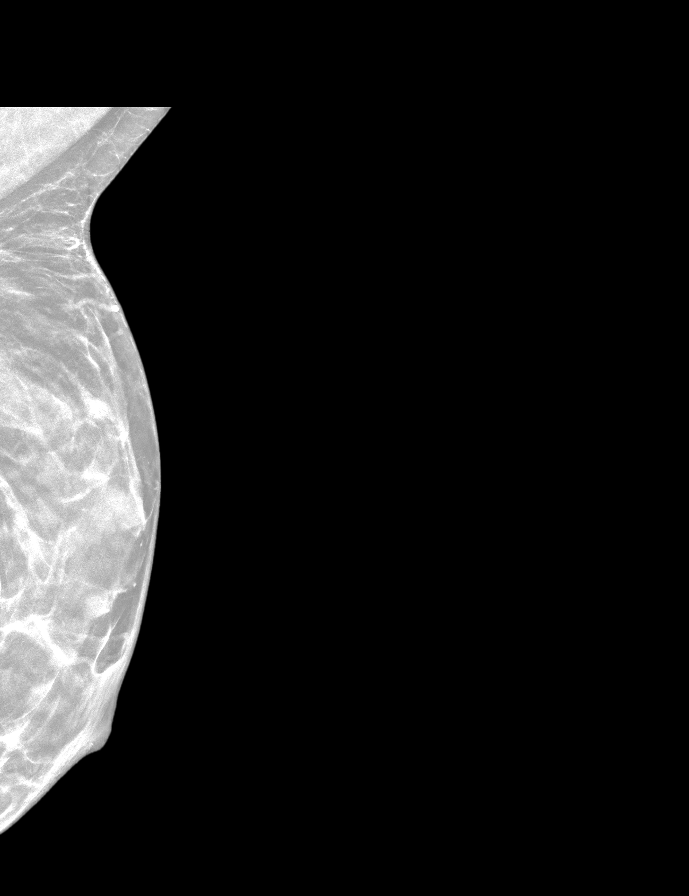

[L MLOID BREAST TOMOSYNTHESIS IMAGE tomo · tomo slice 19/36.0]
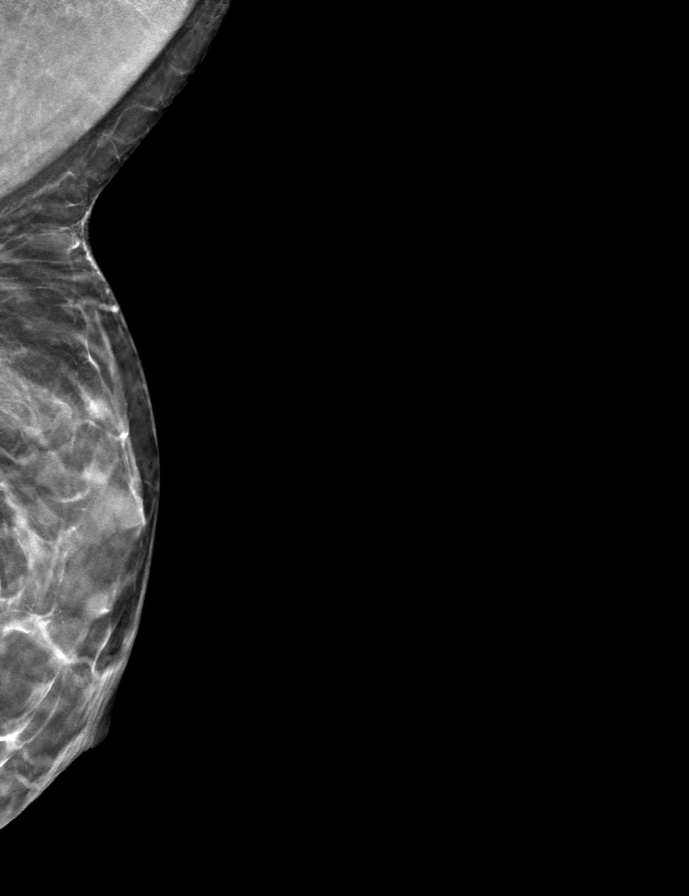

[L CCID BREAST TOMOSYNTHESIS IMAGE tomo · tomo slice 14/27.0]
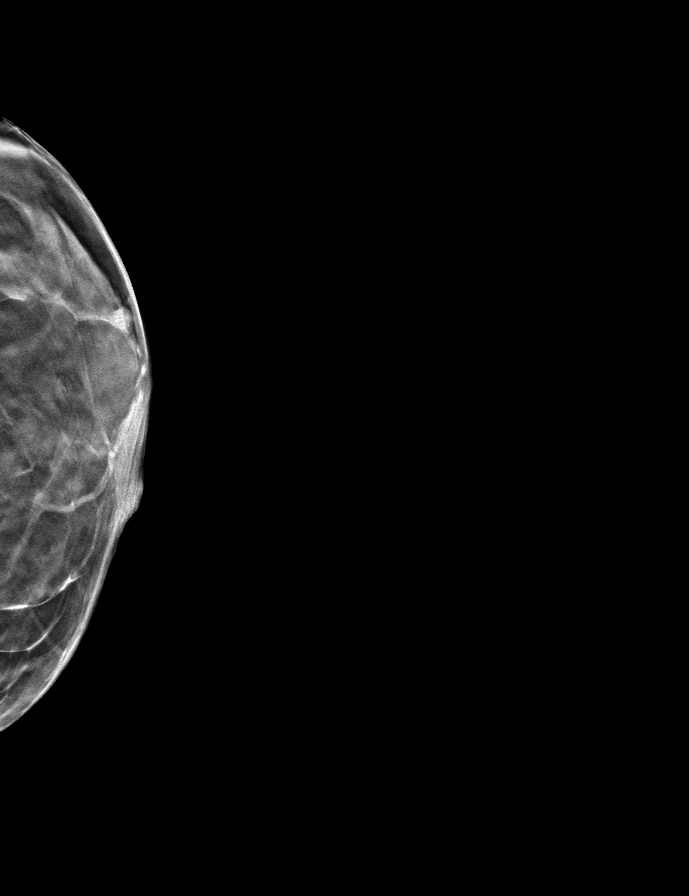

[8 of 16 positions shown; findings below may reference images not displayed]

ACR Breast Density Category c: The breast tissue is heterogeneously
dense, which may obscure small masses.
FINDINGS: The patient has retropectoral implants. Full field CC and MLO views,
implant displaced CC and MLO views, and spot magnification CC and
mediolateral views of the calcifications were obtained.

The circumscribed isodense mass with calcifications in the UPPER
OUTER QUADRANT at anterior depth is unchanged since the diagnostic
mammogram 6 months ago measuring approximately 6 mm. No new or
suspicious findings elsewhere.

Targeted ultrasound is performed, again demonstrating the anechoic
mass with peripheral wall calcifications at the 2:30 o'clock
position 2 cm from the nipple, currently measuring 6 x 3 x 5 mm
(previously 8 x 3 x 4 mm on 02/27/2021), demonstrating posterior
acoustic enhancement.
IMPRESSION: 1. No mammographic or sonographic evidence of malignancy involving
the LEFT breast.
2. Benign 6 mm mildly complicated cyst with wall calcifications in
the UPPER OUTER QUADRANT at anterior depth which has slightly
decreased in size since the prior ultrasound 6 months ago.

RECOMMENDATION:
Annual BILATERAL screening mammography which is due in January 2022.

I have discussed the findings and recommendations with the patient.
If applicable, a reminder letter will be sent to the patient
regarding the next appointment.

BI-RADS CATEGORY  2: Benign.

## 2023-03-18 ENCOUNTER — Other Ambulatory Visit: Payer: Self-pay | Admitting: Family Medicine

## 2023-03-18 DIAGNOSIS — Z1231 Encounter for screening mammogram for malignant neoplasm of breast: Secondary | ICD-10-CM

## 2023-05-08 ENCOUNTER — Ambulatory Visit: Payer: Commercial Managed Care - PPO

## 2023-06-05 ENCOUNTER — Ambulatory Visit: Payer: Commercial Managed Care - PPO

## 2023-06-26 ENCOUNTER — Ambulatory Visit
Admission: RE | Admit: 2023-06-26 | Discharge: 2023-06-26 | Disposition: A | Discharge status: Home / Self Care | Payer: Commercial Managed Care - PPO | Source: Ambulatory Visit | Attending: Family Medicine | Admitting: Family Medicine

## 2023-06-26 DIAGNOSIS — Z1231 Encounter for screening mammogram for malignant neoplasm of breast: Secondary | ICD-10-CM

## 2023-07-25 ENCOUNTER — Encounter: Payer: Self-pay | Admitting: Obstetrics and Gynecology

## 2023-07-25 ENCOUNTER — Telehealth: Payer: Self-pay | Admitting: Genetic Counselor

## 2023-07-25 ENCOUNTER — Ambulatory Visit (INDEPENDENT_AMBULATORY_CARE_PROVIDER_SITE_OTHER): Payer: Commercial Managed Care - PPO | Admitting: Obstetrics and Gynecology

## 2023-07-25 VITALS — BP 112/68 | HR 73 | Ht 66.0 in | Wt 131.0 lb

## 2023-07-25 DIAGNOSIS — Z8041 Family history of malignant neoplasm of ovary: Secondary | ICD-10-CM | POA: Diagnosis not present

## 2023-07-25 DIAGNOSIS — N958 Other specified menopausal and perimenopausal disorders: Secondary | ICD-10-CM | POA: Insufficient documentation

## 2023-07-25 DIAGNOSIS — Z01419 Encounter for gynecological examination (general) (routine) without abnormal findings: Secondary | ICD-10-CM | POA: Insufficient documentation

## 2023-07-25 MED ORDER — ESTRADIOL 0.1 MG/GM VA CREA: TOPICAL_CREAM | VAGINAL | 1 refills | Status: DC

## 2023-07-25 NOTE — Assessment & Plan Note (Signed)
Reviewed safety profile of low dose vaginal estrogen, however reviewed that higher doses have been associated with DVT, breast and uterine cancer.

## 2023-07-25 NOTE — Telephone Encounter (Signed)
 Felicia Greene

## 2023-07-25 NOTE — Patient Instructions (Signed)
 For patients under 50-52yo, I recommend 1200mg  calcium  daily and 600IU of vitamin D daily. For patients over 52yo, I recommend 1200mg  calcium  daily and 800IU of vitamin D daily.  Health Maintenance, Female Adopting a healthy lifestyle and getting preventive care are important in promoting health and wellness. Ask your health care provider about: The right schedule for you to have regular tests and exams. Things you can do on your own to prevent diseases and keep yourself healthy. What should I know about diet, weight, and exercise? Eat a healthy diet  Eat a diet that includes plenty of vegetables, fruits, low-fat dairy products, and lean protein. Do not eat a lot of foods that are high in solid fats, added sugars, or sodium. Maintain a healthy weight Body mass index (BMI) is used to identify weight problems. It estimates body fat based on height and weight. Your health care provider can help determine your BMI and help you achieve or maintain a healthy weight. Get regular exercise Get regular exercise. This is one of the most important things you can do for your health. Most adults should: Exercise for at least 150 minutes each week. The exercise should increase your heart rate and make you sweat (moderate-intensity exercise). Do strengthening exercises at least twice a week. This is in addition to the moderate-intensity exercise. Spend less time sitting. Even light physical activity can be beneficial. Watch cholesterol and blood lipids Have your blood tested for lipids and cholesterol at 52 years of age, then have this test every 5 years. Have your cholesterol levels checked more often if: Your lipid or cholesterol levels are high. You are older than 52 years of age. You are at high risk for heart disease. What should I know about cancer screening? Depending on your health history and family history, you may need to have cancer screening at various ages. This may include screening  for: Breast cancer. Cervical cancer. Colorectal cancer. Skin cancer. Lung cancer. What should I know about heart disease, diabetes, and high blood pressure? Blood pressure and heart disease High blood pressure causes heart disease and increases the risk of stroke. This is more likely to develop in people who have high blood pressure readings or are overweight. Have your blood pressure checked: Every 3-5 years if you are 25-57 years of age. Every year if you are 24 years old or older. Diabetes Have regular diabetes screenings. This checks your fasting blood sugar level. Have the screening done: Once every three years after age 62 if you are at a normal weight and have a low risk for diabetes. More often and at a younger age if you are overweight or have a high risk for diabetes. What should I know about preventing infection? Hepatitis B If you have a higher risk for hepatitis B, you should be screened for this virus. Talk with your health care provider to find out if you are at risk for hepatitis B infection. Hepatitis C Testing is recommended for: Everyone born from [birth year] through 1964. Anyone with known risk factors for hepatitis C. Sexually transmitted infections (STIs) Get screened for STIs, including gonorrhea and chlamydia, if: You are sexually active and are younger than 52 years of age. You are older than 52 years of age and your health care provider tells you that you are at risk for this type of infection. Your sexual activity has changed since you were last screened, and you are at increased risk for chlamydia or gonorrhea. Ask your health care provider if  you are at risk. Ask your health care provider about whether you are at high risk for HIV. Your health care provider may recommend a prescription medicine to help prevent HIV infection. If you choose to take medicine to prevent HIV, you should first get tested for HIV. You should then be tested every 3 months for as long as you  are taking the medicine. Osteoporosis and menopause Osteoporosis is a disease in which the bones lose minerals and strength with aging. This can result in bone fractures. If you are 72 years old or older, or if you are at risk for osteoporosis and fractures, ask your health care provider if you should: Be screened for bone loss. Take a calcium  or vitamin D supplement to lower your risk of fractures. Be given hormone replacement therapy (HRT) to treat symptoms of menopause. Follow these instructions at home: Alcohol use Do not drink alcohol if: Your health care provider tells you not to drink. You are pregnant, may be pregnant, or are planning to become pregnant. If you drink alcohol: Limit how much you have to: 0-1 drink a day. Know how much alcohol is in your drink. In the [place] one drink equals one 12 oz bottle of beer (355 mL), one 5 oz glass of wine (148 mL), or one 1 oz glass of hard liquor (44 mL). Lifestyle Do not use any products that contain nicotine or tobacco. These products include cigarettes, chewing tobacco, and vaping devices, such as e-cigarettes. If you need help quitting, ask your health care provider. Do not use street drugs. Do not share needles. Ask your health care provider for help if you need support or information about quitting drugs. General instructions Schedule regular health, dental, and eye exams. Stay current with your vaccines. Tell your health care provider if: You often feel depressed. You have ever been abused or do not feel safe at home. Summary Adopting a healthy lifestyle and getting preventive care are important in promoting health and wellness. Follow your health care provider's instructions about healthy diet, exercising, and getting tested or screened for diseases. Follow your health care provider's instructions on monitoring your cholesterol and blood pressure. This information is not intended to replace advice given to you by your health  care provider. Make sure you discuss any questions you have with your health care provider. Document Revised: 11/27/2020 Document Reviewed: 11/27/2020 Elsevier Patient Education  2024 [employer].

## 2023-07-25 NOTE — Progress Notes (Signed)
 53 y.o. G0P0000 female s/p TAH, fm hx of breast and ovarian cancer (genetics referral sent today), [facility] here for annual exam. Married. Dietician.  Pt states she still has her ovaries and her paternal aunt had ovarian cancer, this is a concern for her.   Abnormal bleeding: None Pelvic discharge or pain: None Breast mass, nipple discharge or skin changes : None Last PAP:     Component Value Date/Time   DIAGPAP  07/01/2022 1658    - Negative for intraepithelial lesion or malignancy (NILM)   ADEQPAP Satisfactory for evaluation. 07/01/2022 1658   Last mammogram: 06/26/23, BIRADS 1, density c Last colonoscopy: 2022, q10yUTD Sexually active: Yes, noticing some pain with intercourse since menopause. Exercising: Yes, walk and hike  GYN HISTORY: Prior TAH  OB History  Gravida Para Term Preterm AB Living  0 0 0 0 0 0  SAB IAB Ectopic Multiple Live Births  0 0 0 0 0    Past Medical History:  Diagnosis Date   Migraines     Past Surgical History:  Procedure Laterality Date   ABDOMINAL HYSTERECTOMY     abdominal    ANTERIOR CRUCIATE LIGAMENT REPAIR Left    AUGMENTATION MAMMAPLASTY Bilateral 1999   saline    Current Outpatient Medications on File Prior to Visit  Medication Sig Dispense Refill   albuterol (VENTOLIN HFA) 108 (90 Base) MCG/ACT inhaler SMARTSIG:2 Puff(s) By Mouth Every 4 Hours PRN     gabapentin (NEURONTIN) 300 MG capsule Take 300 mg by mouth 3 (three) times daily.     ibuprofen (ADVIL) 800 MG tablet Take by mouth.     methocarbamol (ROBAXIN) 500 MG tablet Take 500 mg by mouth 4 (four) times daily.     Multiple Vitamin (MULTI-VITAMIN) tablet Take 1 tablet by mouth daily.     omega-3 fish oil (MAXEPA) 1000 MG CAPS capsule Take by mouth.     SUMAtriptan (IMITREX) 50 MG tablet Take by mouth.     No current facility-administered medications on file prior to visit.    Social History   Socioeconomic History   Marital status: Married    Spouse name: Not on file    Number of children: Not on file   Years of education: Not on file   Highest education level: Not on file  Occupational History   Not on file  Tobacco Use   Smoking status: Never   Smokeless tobacco: Never  Vaping Use   Vaping status: Never Used  Substance and Sexual Activity   Alcohol use: Yes    Alcohol/week: 4.0 standard drinks of alcohol    Types: 4 Glasses of wine per week    Comment: 4-5 GLASSESS A WEEK    Drug use: Never   Sexual activity: Yes    Partners: Male    Birth control/protection: Surgical    Comment: 1st intercourse- 16, partners- 9, married- 26 yrs, hysterectomy  Other Topics Concern   Not on file  Social History Narrative   Not on file   Social Drivers of Health   Financial Resource Strain: Low Risk  (05/29/2023)   Received from [facility]   Overall Financial Resource Strain (CARDIA)    Difficulty of Paying Living Expenses: Not hard at all  Food Insecurity: Not on file  Transportation Needs: Not on file  Physical Activity: Not on file  Stress: Not on file  Social Connections: Unknown (12/04/2021)   [facility]   Social Network  Social Network: Not on file  Intimate Partner Violence: Unknown (10/25/2021)   Received from [facility]   HITS    Physically Hurt: Not on file    Insult or Talk Down To: Not on file    Threaten Physical Harm: Not on file    Scream or Curse: Not on file    Family History  Problem Relation Age of Onset   Hypertension Father    Diabetes Brother    Ovarian cancer Paternal Aunt 69   Breast cancer Paternal Grandmother     No Known Allergies    PE Today's Vitals   07/25/23 0751  BP: 112/68  Pulse: 73  SpO2: 98%  Weight: 131 lb (59.4 kg)  Height: 5' 6 (1.676 m)   Body mass index is 21.14 kg/m.  Physical Exam Vitals reviewed. Exam conducted with a chaperone present.  Constitutional:      General: She is not in acute distress.    Appearance:  Normal appearance.  HENT:     Head: Normocephalic and atraumatic.     Nose: Nose normal.  Eyes:     Extraocular Movements: Extraocular movements intact.     Conjunctiva/sclera: Conjunctivae normal.  Neck:     Thyroid: No thyroid mass, thyromegaly or thyroid tenderness.  Pulmonary:     Effort: Pulmonary effort is normal.  Chest:     Chest wall: No mass or tenderness.  Breasts:    Right: Normal. No swelling, mass, nipple discharge or tenderness.     Left: Normal. No swelling, mass, nipple discharge or tenderness.     Comments: Bilateral breast implants Abdominal:     General: There is no distension.     Palpations: Abdomen is soft.     Tenderness: There is no abdominal tenderness.  Genitourinary:    General: Normal vulva.     Exam position: Lithotomy position.     Urethra: No prolapse.     Vagina: Normal. No vaginal discharge or bleeding.     Cervix: No lesion.     Adnexa: Right adnexa normal and left adnexa normal.     Comments: Cervix and uterus absent Musculoskeletal:        General: Normal range of motion.     Cervical back: Normal range of motion.  Lymphadenopathy:     Upper Body:     Right upper body: No axillary adenopathy.     Left upper body: No axillary adenopathy.     Lower Body: No right inguinal adenopathy. No left inguinal adenopathy.  Skin:    General: Skin is warm and dry.  Neurological:     General: No focal deficit present.     Mental Status: She is alert.  Psychiatric:        Mood and Affect: Mood normal.        Behavior: Behavior normal.      Assessment and Plan:        Well woman exam with routine gynecological exam Assessment & Plan: Cervical cancer screening performed according to ASCCP guidelines. Encouraged annual mammogram screening Colonoscopy UTD DXA never Labs and immunizations with her primary Encouraged safe sexual practices as indicated Encouraged healthy lifestyle practices with diet and exercise For patients under 50-70yo, I  recommend 1200mg  calcium daily and 600IU of vitamin D daily.    Genitourinary syndrome of menopause Assessment & Plan: Reviewed safety profile of low dose vaginal estrogen, however reviewed that higher doses have been associated with DVT, breast and uterine cancer.   Orders: -  Estradiol ; Apply 1/2 gram to vulva nightly for 2 weeks then decrease to 1/2 gram to vulva two nights a week.  Dispense: 42.5 g; Refill: 1  Family history of ovarian cancer -     Ambulatory referral to Genetics    Vera LULLA Pa, MD

## 2023-07-25 NOTE — Assessment & Plan Note (Signed)
 Cervical cancer screening performed according to ASCCP guidelines. Encouraged annual mammogram screening Colonoscopy UTD DXA never Labs and immunizations with her primary Encouraged safe sexual practices as indicated Encouraged healthy lifestyle practices with diet and exercise For patients under 50-52yo, I recommend 1200mg  calcium daily and 600IU of vitamin D daily.

## 2023-08-07 ENCOUNTER — Other Ambulatory Visit: Payer: Self-pay

## 2023-08-07 DIAGNOSIS — N958 Other specified menopausal and perimenopausal disorders: Secondary | ICD-10-CM

## 2023-08-07 MED ORDER — ESTRADIOL 0.1 MG/GM VA CREA: TOPICAL_CREAM | VAGINAL | 1 refills | Status: AC

## 2023-08-07 NOTE — Telephone Encounter (Signed)
Pt requested prescription to be sent to [facility] in [facility] [place]. Changed and routed to provider

## 2023-10-03 NOTE — Progress Notes (Deleted)
 REFERRING PROVIDER: Rosalyn Gess, MD [address] [place],  [place] [address]   PRIMARY PROVIDER:  Debbra Riding Hestle, PA-C  PRIMARY REASON FOR VISIT:  No diagnosis found.   HISTORY OF PRESENT ILLNESS:   Ms. Boom, a 53 y.o. female, was seen for a [facility] cancer genetics consultation at the request of Dr. Kennith Center due to a family history of breast and ovarian cancer.  Ms. Stoklosa presents to clinic today to discuss the possibility of a hereditary predisposition to cancer, to discuss genetic testing, and to further clarify her future cancer risks, as well as potential cancer risks for family members.     Ms. Boyland is a 53 y.o. female with no personal history of cancer.      CANCER HISTORY:  Oncology History   No history exists.     SCREENING/RISK FACTORS:  Mammogram within the last year: yes; most recent Dec 2024; category C density  Number of breast biopsies: ***. Colonoscopy: ***; ***. Total abdominal hysterectomy and bilateral salingectomy in 2014 due to nenorrhagia.  Ovaries intact: yes.  Menarche was at age ***.  First live birth at age ***.  Menopausal status: {Menopause:31378}.  OCP use for approximately {Numbers 1-12 multi-select:20307} years.  HRT use: {Numbers 1-12 multi-select:20307} years. History of radiation therapy to breast tissue before age 56: {Yes/No-Ex:120004} Dermatology screening: {Yes/No-Ex:120004}  Blood transfusion within past 4 weeks: {Yes/No-Ex:120004} History of bone marrow transplant: {Yes/No-Ex:120004}  Past Medical History:  Diagnosis Date   Migraines     Past Surgical History:  Procedure Laterality Date   ABDOMINAL HYSTERECTOMY     abdominal    ANTERIOR CRUCIATE LIGAMENT REPAIR Left    AUGMENTATION MAMMAPLASTY Bilateral 1999   saline     FAMILY HISTORY:  We obtained a detailed, 4-generation family history.  Significant diagnoses are listed below: Family History  Problem Relation Age of Onset    Hypertension Father    Diabetes Brother    Ovarian cancer Paternal Aunt 41   Breast cancer Paternal Grandmother     Ms. Stowell is {aware/unaware} of previous family history of genetic testing for hereditary cancer risks. Patient's maternal ancestors are of *** descent, and paternal ancestors are of *** descent. There {IS NO:12509} reported Ashkenazi Jewish ancestry. There {IS NO:12509} known consanguinity.  GENETIC COUNSELING ASSESSMENT: Ms. Broecker is a 53 y.o. female with a {Personal/family:20331} history of {cancer/polyps} which is somewhat suggestive of a {DISEASE} and predisposition to cancer given ***. We, therefore, discussed and recommended the following at today's visit.   DISCUSSION: We discussed that *** - ***% of *** is hereditary, with most cases of hereditary *** cancer associated with ***.  There are other genes that can be associated with hereditary *** cancer syndromes.  These include ***.  We discussed that testing is beneficial for several reasons, including knowing about other cancer risks, identifying potential screening and risk-reduction options that may be appropriate, and understanding if other family members could be at an increased risk for cancer and allowing them to undergo genetic testing.  We reviewed the characteristics, features and inheritance patterns of hereditary cancer syndromes. We also discussed genetic testing, including the appropriate family members to test, the process of testing, insurance coverage and turn-around-time for results. We discussed the implications of a negative, positive, and variant of uncertain significant result. ***We discussed that negative results would be uninformative given that Ms. Yoak does not have a personal history of cancer. We recommended Ms. Sigl pursue genetic testing for a panel  that contains genes associated with ***.  Ms. Nordmann was offered a common hereditary cancer panel (~40 genes) and an expanded pan-cancer panel  (~70 genes). Ms. Gundry was informed of the benefits and limitations of each panel, including that expanded pan-cancer panels contain several genes that do not have clear management guidelines at this point in time.  We also discussed that as the number of genes included on a panel increases, the chances of variants of uncertain significance increases.  After considering the benefits and limitations of each gene panel, Ms. Bartha elected to have an *** through ***.   Based on Ms. Maita's {Personal/family:20331} history of cancer, she meets medical criteria for genetic testing. Despite that she meets criteria, she may still have an out of pocket cost. We discussed that if her out of pocket cost for testing is over $100, the laboratory should contact them to discuss self-pay options and/or patient pay assistance programs.   ***We reviewed the characteristics, features and inheritance patterns of hereditary cancer syndromes. We also discussed genetic testing, including the appropriate family members to test, the process of testing, insurance coverage and turn-around-time for results. We discussed the implications of a negative, positive and/or variant of uncertain significant result. In order to get genetic test results in a timely manner so that Ms. Birge can use these genetic test results for surgical decisions, we recommended Ms. Ezra pursue genetic testing for the ***. Once complete, we recommend Ms. Abello pursue reflex genetic testing to the *** gene panel.   Based on Ms. Wagler's {Personal/family:20331} history of cancer, she meets medical criteria for genetic testing. Despite that she meets criteria, she may still have an out of pocket cost.   ***We discussed with Ms. Accomando that the {Personal/family:20331} history does not meet insurance or NCCN criteria for genetic testing and, therefore, is not highly consistent with a familial hereditary cancer syndrome.  We feel she is at low risk to  harbor a gene mutation associated with such a condition. Thus, we did not recommend any genetic testing, at this time, and recommended Ms. Amaker continue to follow the cancer screening guidelines given by her primary healthcare provider.  ***In order to estimate her chance of having a {CA GENE:62345} mutation, we used statistical models ({GENMODELS:62370}) that consider her personal medical history, family history and ancestry.  Because each model is different, there can be a lot of variability in the risks they give.  Therefore, these numbers must be considered a rough range and not a precise risk of having a {CA GENE:62345} mutation.  These models estimate that she has approximately a ***-***% chance of having a mutation. Based on this assessment of her family and personal history, genetic testing {IS/ISNOT:34056} recommended.  ***Based on the patient's {Personal/family:20331} history, a statistical model ({GENMODELS:62370}) was used to estimate her risk of developing {CA HX:54794}. This estimates her lifetime risk of developing {CA HX:54794} to be approximately ***%. This estimation does not consider any genetic testing results.  The patient's lifetime breast cancer risk is a preliminary estimate based on available information using one of several models endorsed by the American Cancer Society (ACS). The ACS recommends consideration of breast MRI screening as an adjunct to mammography for patients at high risk (defined as 20% or greater lifetime risk).   ***Ms. Rexroad has been determined to be at high risk for breast cancer.  Therefore, we recommend that annual screening with mammography and breast MRI be performed.  ***begin at age 17, or 10 years prior to  the age of breast cancer diagnosis in a relative (whichever is earlier).  We discussed that Ms. Mclester should discuss her individual situation with her referring physician and determine a breast cancer screening plan with which they are both  comfortable.    We discussed the Genetic Information Non-Discrimination Act (GINA) of 2008, which helps protect individuals against genetic discrimination based on their genetic test results.  It impacts both health insurance and employment.  With health insurance, it protects against genetic test results being used for increased premiums or policy termination. For employment, it protects against hiring, firing and promoting decisions based on genetic test results.  GINA does not apply to those in the [employer], those who work for companies with less than 15 employees, and new life insurance or long-term disability insurance policies.  Health status due to a cancer diagnosis is not protected under GINA.  PLAN: After considering the risks, benefits, and limitations, Ms. Wooton provided informed consent to pursue genetic testing and the blood sample was sent to {Lab} Laboratories for analysis of the {test}. Results should be available within approximately {TAT TIME} weeks' time, at which point they will be disclosed by telephone to Ms. Guidone, as will any additional recommendations warranted by these results. Ms. Cassidy will receive a summary of her genetic counseling visit and a copy of her results once available. This information will also be available in Epic.   *** Despite our recommendation, Ms. Hegwood did not wish to pursue genetic testing at today's visit. We understand this decision and remain available to coordinate genetic testing at any time in the future. We, therefore, recommend Ms. Churchman continue to follow the cancer screening guidelines given by her primary healthcare provider.  ***Based on Ms. Mitton's family history, we recommended her ***, who was diagnosed with *** at age ***, have genetic counseling and testing. Ms. Franzoni will let us know if we can be of any assistance in coordinating genetic counseling and/or testing for appropriate family members.   Lastly, we encouraged Ms.  Knaggs to remain in contact with cancer genetics annually so that we can continuously update the family history and inform her of any changes in cancer genetics and testing that may be of benefit for this family.   Ms. Nations questions were answered to her satisfaction today. Our contact information was provided should additional questions or concerns arise. ***Thank you for the referral and allowing [place] to share in the care of your patient.   Harlowe Dowler M. Rennie Plowman, MS, [facility] Genetic Counselor [email or web address] (P) [phone]    *** minutes were spent on the date of the encounter in service to the patient including preparation, face-to-face consultation, documentation and care coordination.  ***The patient was accompanied by ***.  ***The patient was seen alone.  Drs. Gunnar Bulla and/or Mosetta Putt were available to discuss this case as needed.   _______________________________________________________________________ For Office Staff:  Number of people involved in session: *** Was an Intern/ student involved with case: {YES/NO:63}

## 2023-10-06 ENCOUNTER — Inpatient Hospital Stay: Payer: Commercial Managed Care - PPO | Admitting: Genetic Counselor

## 2023-10-06 ENCOUNTER — Other Ambulatory Visit: Payer: Commercial Managed Care - PPO

## 2023-11-27 ENCOUNTER — Encounter: Admitting: Genetic Counselor

## 2023-11-27 ENCOUNTER — Other Ambulatory Visit

## 2023-12-11 ENCOUNTER — Encounter: Admitting: Genetic Counselor

## 2023-12-11 ENCOUNTER — Other Ambulatory Visit

## 2024-02-11 ENCOUNTER — Telehealth: Payer: Self-pay | Admitting: Obstetrics and Gynecology

## 2024-02-11 NOTE — Telephone Encounter (Signed)
 Courteous Notification regarding patient Genetic Referral appointment for Thurs February 12 2024 11:00am- patient was emailed this morning a friendly reminder of visit- additionally a voicemail message was left for patient just in case she did not see or receive email. 12:42pm patient called to confirm 2:51pm patient called back to direct to cancel appointment. Attempted to get reasoning and reschedule. She only said cancel.   Thank you

## 2024-02-12 ENCOUNTER — Inpatient Hospital Stay: Admitting: Genetic Counselor

## 2024-02-12 ENCOUNTER — Inpatient Hospital Stay

## 2024-06-01 ENCOUNTER — Other Ambulatory Visit: Payer: Self-pay | Admitting: Family Medicine

## 2024-06-01 DIAGNOSIS — Z1231 Encounter for screening mammogram for malignant neoplasm of breast: Secondary | ICD-10-CM

## 2024-07-01 ENCOUNTER — Ambulatory Visit

## 2024-07-02 ENCOUNTER — Ambulatory Visit: Admitting: Obstetrics and Gynecology

## 2024-07-02 ENCOUNTER — Encounter: Payer: Self-pay | Admitting: Obstetrics and Gynecology

## 2024-07-02 VITALS — BP 98/64 | HR 72 | Temp 97.9°F | Wt 129.0 lb

## 2024-07-02 DIAGNOSIS — N951 Menopausal and female climacteric states: Secondary | ICD-10-CM | POA: Diagnosis not present

## 2024-07-02 DIAGNOSIS — G43909 Migraine, unspecified, not intractable, without status migrainosus: Secondary | ICD-10-CM | POA: Insufficient documentation

## 2024-07-02 NOTE — Patient Instructions (Signed)
 Start taking gabapentin 300mg  twice a day.

## 2024-07-02 NOTE — Assessment & Plan Note (Addendum)
 Discussed nonhormonal options for menopausal symptoms, including SSRIs, veozah, gabapetin.  Predominant symptoms is exhaustion, then anxiety. Patient elects for gabapentin. Will start 300mg  BID, could titrate to 300mg  qAM and 600mg  at bedtime.  Gabapentin has side effects such as dizziness, drowsiness, mood irritability/lability, GI upset, and rash.  She has tolerated it well, without daytime drowsiness.  Consider adding SSRI if no improvement in 6-12wk as well as CBT Taking MTV and fish oil Encouraged full body strength training Will f/u for annual exam

## 2024-07-02 NOTE — Progress Notes (Signed)
 53 y.o. G0P0000 female s/p TAH, fm hx of breast and ovarian cancer (declined genetics referral), [facility] here for problem visit. Married. 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  No LMP recorded. Patient has had a hysterectomy.   She reports exhaustion, brain fog, anxiety, hot flashes, night sweats - has been having symptoms for ~1 yr. Most predominant is exhaustion, then anxiety. Does not feels she needs management of hot flashes Noticed that A1c and cholesterol were elevated last year, both improved this year with dietary changes Wants to use hormonal therapy as last resort for symptoms managment  Spinal disc issues- using gabapentin BID PRN Takes zquil-like sleep aid at night: sleeps 8-9hr, feels refreshed when she awakes, exhausted half through day Exercise: 4d/wk, walking, hiking with dogs, some upper body strength training  Sexually active: Yes, noticing some pain with intercourse since menopause. Using lubricant.  OB History  Gravida Para Term Preterm AB Living  0 0 0 0 0 0  SAB IAB Ectopic Multiple Live Births  0 0 0 0 0   Past Medical History:  Diagnosis Date   Migraines    Past Surgical History:  Procedure Laterality Date   ABDOMINAL HYSTERECTOMY     abdominal    ANTERIOR CRUCIATE LIGAMENT REPAIR Left    AUGMENTATION MAMMAPLASTY Bilateral 1999   saline   Medications Ordered Prior to Encounter[1] Allergies[2]    PE Today's Vitals   07/02/24 1201  BP: 98/64  Pulse: 72  Temp: 97.9 F (36.6 C)  TempSrc: Oral  SpO2: 98%  Weight: 129 lb (58.5 kg)   Body mass index is 20.82 kg/m.  Physical Exam Vitals reviewed.  Constitutional:      General: She is not in acute distress.    Appearance: Normal appearance.  HENT:     Head: Normocephalic and atraumatic.     Nose: Nose normal.  Eyes:     Extraocular Movements: Extraocular movements intact.     Conjunctiva/sclera: Conjunctivae normal.  Pulmonary:     Effort: Pulmonary effort is normal.  Musculoskeletal:         General: Normal range of motion.     Cervical back: Normal range of motion.  Neurological:     General: No focal deficit present.     Mental Status: She is alert.  Psychiatric:        Mood and Affect: Mood normal.        Behavior: Behavior normal.      Assessment and Plan:        Menopausal symptoms Assessment & Plan: Discussed nonhormonal options for menopausal symptoms, including SSRIs, veozah, gabapetin.  Predominant symptoms is exhaustion, then anxiety. Patient elects for gabapentin. Will start 300mg  BID, could titrate to 300mg  qAM and 600mg  at bedtime.  Gabapentin has side effects such as dizziness, drowsiness, mood irritability/lability, GI upset, and rash.  She has tolerated it well, without daytime drowsiness.  Consider adding SSRI if no improvement in 6-12wk as well as CBT Taking MTV and fish oil Encouraged full body strength training Will f/u for annual exam   25 mins  total time was spent for this patient encounter, including preparation, face-to-face counseling with the patient, coordination of care, and documentation of the encounter.    Vera LULLA Pa, MD     [1]  Current Outpatient Medications on File Prior to Visit  Medication Sig Dispense Refill   albuterol (VENTOLIN HFA) 108 (90 Base) MCG/ACT inhaler SMARTSIG:2 Puff(s) By Mouth Every 4 Hours PRN     fluocinonide (  LIDEX) 0.05 % external solution as directed please see attached for detailed directions     gabapentin (NEURONTIN) 300 MG capsule Take 300 mg by mouth 3 (three) times daily.     ibuprofen (ADVIL) 800 MG tablet Take by mouth.     methocarbamol (ROBAXIN) 500 MG tablet Take 500 mg by mouth 4 (four) times daily.     Multiple Vitamin (MULTI-VITAMIN) tablet Take 1 tablet by mouth daily.     omega-3 fish oil (MAXEPA) 1000 MG CAPS capsule Take by mouth.     SUMAtriptan (IMITREX) 50 MG tablet Take by mouth.     estradiol  (ESTRACE  VAGINAL) 0.1 MG/GM vaginal cream Apply 1/2 gram to vulva nightly  for 2 weeks then decrease to 1/2 gram to vulva two nights a week. (Patient not taking: Reported on 07/02/2024) 42.5 g 1   No current facility-administered medications on file prior to visit.  [2] No Known Allergies

## 2024-07-09 ENCOUNTER — Other Ambulatory Visit: Payer: Self-pay

## 2024-07-09 MED ORDER — GABAPENTIN 300 MG PO CAPS: 300.0000 mg | ORAL_CAPSULE | Freq: Two times a day (BID) | ORAL | 3 refills | Status: AC

## 2024-07-09 NOTE — Telephone Encounter (Signed)
 Med refill request:   Gabapentin  (NEURONTIN ) 300 MG capsule Start:  02/21/21 300 mg, 3 times daily  Refills:  0  Last AEX:  07/25/23 Next AEX:  08/05/24 Last MMG (if hormonal med):  N/A Refill authorized? Please Advise.

## 2024-07-09 NOTE — Telephone Encounter (Signed)
 Patient left message on triage line requesting update on status of refill. States she discussed GYN managing Rx when she was in for OV 07/02/24.   Routing to Dr. Dallie.

## 2024-08-05 ENCOUNTER — Ambulatory Visit: Admitting: Obstetrics and Gynecology

## 2024-08-26 ENCOUNTER — Ambulatory Visit

## 2024-09-02 ENCOUNTER — Ambulatory Visit

## 2024-09-09 ENCOUNTER — Ambulatory Visit

## 2024-09-16 ENCOUNTER — Ambulatory Visit: Admitting: Obstetrics and Gynecology
